# Patient Record
Sex: Male | Born: 1950
Health system: Southern US, Community
[De-identification: ages and names within clinical notes are randomized; demographics above are authoritative.]

## PROBLEM LIST (undated history)

## (undated) DIAGNOSIS — K648 Other hemorrhoids: Secondary | ICD-10-CM

## (undated) DIAGNOSIS — E785 Hyperlipidemia, unspecified: Secondary | ICD-10-CM

## (undated) DIAGNOSIS — C801 Malignant (primary) neoplasm, unspecified: Secondary | ICD-10-CM

## (undated) DIAGNOSIS — C61 Malignant neoplasm of prostate: Secondary | ICD-10-CM

## (undated) DIAGNOSIS — D352 Benign neoplasm of pituitary gland: Secondary | ICD-10-CM

## (undated) DIAGNOSIS — T7840XA Allergy, unspecified, initial encounter: Secondary | ICD-10-CM

## (undated) DIAGNOSIS — K219 Gastro-esophageal reflux disease without esophagitis: Secondary | ICD-10-CM

## (undated) DIAGNOSIS — K635 Polyp of colon: Secondary | ICD-10-CM

## (undated) HISTORY — DX: Malignant neoplasm of prostate: C61

## (undated) HISTORY — DX: Benign neoplasm of pituitary gland: D35.2

## (undated) HISTORY — PX: PROSTATECTOMY: SHX69

## (undated) HISTORY — DX: Malignant (primary) neoplasm, unspecified: C80.1

## (undated) HISTORY — DX: Polyp of colon: K63.5

## (undated) HISTORY — PX: COLONOSCOPY: SHX174

## (undated) HISTORY — DX: Other hemorrhoids: K64.8

## (undated) HISTORY — DX: Allergy, unspecified, initial encounter: T78.40XA

## (undated) HISTORY — DX: Hyperlipidemia, unspecified: E78.5

---

## 1990-02-11 DIAGNOSIS — D352 Benign neoplasm of pituitary gland: Secondary | ICD-10-CM

## 1990-02-11 HISTORY — DX: Benign neoplasm of pituitary gland: D35.2

## 2003-02-12 DIAGNOSIS — C801 Malignant (primary) neoplasm, unspecified: Secondary | ICD-10-CM

## 2003-02-12 DIAGNOSIS — C61 Malignant neoplasm of prostate: Secondary | ICD-10-CM

## 2003-02-12 HISTORY — DX: Malignant neoplasm of prostate: C61

## 2003-02-12 HISTORY — DX: Malignant (primary) neoplasm, unspecified: C80.1

## 2003-02-12 HISTORY — PX: PROSTATE SURGERY: SHX751

## 2005-05-22 ENCOUNTER — Ambulatory Visit: Payer: Self-pay | Admitting: Urology

## 2005-06-10 ENCOUNTER — Ambulatory Visit: Payer: Self-pay | Admitting: Urology

## 2006-03-20 ENCOUNTER — Ambulatory Visit: Payer: Self-pay | Admitting: Gastroenterology

## 2008-10-05 ENCOUNTER — Ambulatory Visit: Payer: Self-pay | Admitting: Family Medicine

## 2008-12-29 ENCOUNTER — Ambulatory Visit: Payer: Self-pay | Admitting: Gastroenterology

## 2010-02-11 DIAGNOSIS — K635 Polyp of colon: Secondary | ICD-10-CM

## 2010-02-11 HISTORY — DX: Polyp of colon: K63.5

## 2010-03-15 DIAGNOSIS — E230 Hypopituitarism: Secondary | ICD-10-CM | POA: Insufficient documentation

## 2011-03-21 DIAGNOSIS — E781 Pure hyperglyceridemia: Secondary | ICD-10-CM | POA: Insufficient documentation

## 2013-02-19 ENCOUNTER — Ambulatory Visit: Payer: Self-pay | Admitting: Family Medicine

## 2014-03-16 ENCOUNTER — Ambulatory Visit (INDEPENDENT_AMBULATORY_CARE_PROVIDER_SITE_OTHER): Payer: BLUE CROSS/BLUE SHIELD | Admitting: Endocrinology

## 2014-03-16 ENCOUNTER — Other Ambulatory Visit: Payer: Self-pay

## 2014-03-16 ENCOUNTER — Encounter: Payer: Self-pay | Admitting: Endocrinology

## 2014-03-16 VITALS — BP 118/78 | HR 72 | Resp 14 | Ht 71.0 in | Wt 205.0 lb

## 2014-03-16 DIAGNOSIS — N529 Male erectile dysfunction, unspecified: Secondary | ICD-10-CM

## 2014-03-16 DIAGNOSIS — D352 Benign neoplasm of pituitary gland: Secondary | ICD-10-CM

## 2014-03-16 NOTE — Progress Notes (Signed)
HPI  Zachary Hayden is a 64 y.o.-year-old male, referred by his PCP, Dr. Rutherford Nail, for evaluation for prolactinoma. He was previously seen Dr Manus Rudd at [facility] , last visit 01/2013- however expense is an concern - hence establishing care here.  Records reviewed from [facility] and patient records. Synopsis is below. Prior hx hyperprolactinemia since 2000. He was first diagnosed  On imaging in 2005 when his prolactin was 960, and MRI showed a 15 mmx 39mm x 20 mm lesion. He has had good suppression of his prolactin with bromocriptine, but when it was tried to be tapered off after 3 years 06/2009, his prolactin levels started to rise in normal range. Otherwise it has been well suppressed. Currently he is on BCR 2.5 twice daily, no problems with fatigue,nausea, LH, or headaches. No breast discharge. Has stable mild enlargement of the breasts bilaterally without any masses.  For his pituitary tumor, it has not shown growth but neither has it shrunk on the medication. Last MRI 2013, no change. No vision loss or headaches. Last visual field test 11/2013, Dr.Charles Sydnor at [facility]. Goes annually. No changes in VF testing per patient report. No change in shoe size. No steroid use. No LH/N/abdominal pain or hypotension.   Additionally, he had hypogonadism when the prolactin was high, which recovered when the prolactin was suppressed. He also developed prostate cancer treated with prostatectomy, so  He has not been supplemented for his testosterone, but it has continued to improve within the normal range, so that has not been necessary.He has a good sense of libido, but mild erectile dysfunction. In the past, he has noticed the significant decrease in libido when his prolactin has risen and testosterone has declined. He reports no new medical problems. Reports that prostrate cancer is in remission. Having occasional hot flashes , no muscle weakness. Feels that his Testosterone levels may be slightly worse  recently.  Would like to have his labs checked locally at [facility]- also needs some testing ordered by his PCP. Would like to wait till age 26 when he qualifies for medicare, to do his follow up MRI testing if possible.   I reviewed his chart and he also has a history of ED and prior low Testosterone.  Prior lab review-  11/1998- PRL 548.8, TSH 1.91, T4 8.6 2001-PRL 660 01/2003- FSH<0.3, LH <0.3, PRL 939 MRI Jan 2005, [facility]- enlarged pituitary gland slight to right of midline, homogenous enhancement post contrast, no stalk deviation, no impingement on optic chiasm, no narrowing of cavernous sinus, ICA; tumor 1.5 x1.5 x2cm, enhancing nodule within pituitary as well 04/2003- TSH 2.69, free T4 0.95, PRL 862, cortisol 13.4, Testosterone 298 ( 179-756) PTH 54, IGF-1 178 ( 90-360) October 2005 - Testosterone low at 20- was on Androgel at that time 01/2004- PRL 9 04/2004- PRL 6, Testosterone 354  MRI Dec 2006- 18mm pituitary mass, decreased T1 signal , increased T2 and hypoenhancement right aspect of sella, displacing pituitary to left 02/2005- PRL 3, Testosterone 257 02/2006-PRL 5, Testosterone 318 03/2008 PRL 3, Testosterone 278 02/2009- PRL 5, Testosterone 310>>dose reduced to bromocriptine 2.5 mg daily 05/2009 -PRL 7, Testos 271 10/2009-PRL 10 03/2010 -PRL 9>>dose increased to 5mg  daily 03/2011-PRL 3, Testos 330  MRI pituitary 01/2012, [facility]- compared to 2010 1.5 x 1.1 cm enhacing mass in the right sella involving right cavernous sinus and displacing pitiutary stalk to left ( grossly unchanged appearance as compared to prior)  02/2012-PRL 6, Testos 329  I have reviewed the patient's past medical  history, family and social history, surgical history, medications and allergies.  Past Medical History  Diagnosis Date  . Pituitary adenoma   . Hyperlipidemia   . Allergy   . Cancer 2005    prostate  . Prostate cancer 2005   Past Surgical History  Procedure Laterality Date  . Prostate surgery  2005     radical  . Prostatectomy     Family History  Problem Relation Age of Onset  . Heart disease Father   . Hyperlipidemia Father   . Hypertension Father   . Other Cousin    History   Social History  . Marital Status: Married    Spouse Name: N/A    Number of Children: N/A  . Years of Education: N/A   Occupational History  . Not on file.   Social History Main Topics  . Smoking status: Never Smoker   . Smokeless tobacco: Never Used  . Alcohol Use: 0.0 oz/week    0 Not specified per week     Comment: glass of wine  . Drug Use: No  . Sexual Activity: Not on file   Other Topics Concern  . Not on file   Social History Narrative   No current outpatient prescriptions on file prior to visit.   No current facility-administered medications on file prior to visit.   No Known Allergies   Review of Systems: [x]  complains of  [  ] denies General:   [  ] Recent weight change [  ] Fatigue  [  ] Loss of appetite Eyes: [  ]  Vision Difficulty [  ]  Eye pain ENT: [  ]  Hearing difficulty [  ]  Difficulty Swallowing CVS: [  ] Chest pain [  ]  Palpitations/Irregular Heart beat [  ]  Shortness of breath lying flat [  ] Swelling of legs Resp: [  ] Frequent Cough [  ] Shortness of Breath  [  ]  Wheezing GI: [ x ] Heartburn  [  ] Nausea or Vomiting  [  ] Diarrhea [  ] Constipation  [  ] Abdominal Pain GU: [  ]  Polyuria  [ x ]  nocturia Bones/joints:  [  ]  Muscle aches  [  ] Joint Pain  [  ] Bone pain Skin/Hair/Nails: [  ]  Rash  [  ] New stretch marks [  ]  Itching [  ] Hair loss [  ]  Excessive hair growth Reproduction: [ x ] Low sexual desire , [  ]  Women: Menstrual cycle problems [  ]  Women: Breast Discharge [  ] Men: Difficulty with erections [ x ]  Men: Enlarged Breasts CNS: [  ] Frequent Headaches [  ] Blurry vision [  ] Tremors [  ] Seizures [  ] Loss of consciousness [  ] Localized weakness Endocrine: [  ]  Excess thirst [ x ]  Feeling excessively hot [  ]  Feeling excessively  cold Heme: [  ]  Easy bruising [  ]  Enlarged glands or lumps in neck Allergy: [  ]  Food allergies [  ] Environmental allergies   PE: Chaperone present BP 118/78 mmHg  Pulse 72  Resp 14  Ht 5\' 11"  (1.803 m)  Wt 205 lb (92.987 kg)  BMI 28.60 kg/m2  SpO2 98% Wt Readings from Last 3 Encounters:  03/16/14 205 lb (92.987 kg)   HEENT: [place]/AT, EOMI, no icterus, no proptosis,  no chemosis, no mild lid lag, no retraction, eyes close completely, gross normal VF testing on confrontation Neck: thyroid gland - smooth, non-tender, no erythema, no tracheal deviation; negative Pemberton's sign; no lymphadenopathy; no bruits Lungs: good air entry, clear bilaterally Heart: S1&S2 normal, regular rate & rhythm; no murmurs, rubs or gallops Breast: some gynecomastia but no discharge Abd: soft, NT, ND, no HSM, +BS, no abnormal straie Ext: no tremor in hands bilaterally, no edema, 2+ DP/PT pulses, good muscle mass Neuro: normal gait, 2+ reflexes bilaterally, normal 5/5 strength, no proximal myopathy  Derm: no pretibial myxoedema/skin dryness  ASSESSMENT: 1. Prolactinoma, stable macroadenoma since 2005 on treatment with Bromocriptine 2. Secondary hypogonadism and ED. Hx prostrate cancer, now in remission.  PLAN:  Problem List Items Addressed This Visit      Endocrine   Prolactinoma - Primary     Hyperprolactinoma- Check labs locally for Prolactin levels. Continue current bromocriptine for now. Unable to taper further as stable macroadenoma on imaging. Adjust dose based on levels.  Also, screen for associated IGF-1 oversecretion. BP at target, test electrolytes and thyroid function.    Pituitary tumor- Can continue current Bromocriptine to maintain normal PRL levels for now. Alternatively, dose can be increased to aim for tumor shrinkage. Will check levels first. Will consider repeat imaging this year if levels are rising. Encouraging that recent VF testing was normal per patient. Per review of prior  endocrine notes at [facility],   His initial MRI from 2005 suggested hemorrhage and fibrosis, and size has not significantly decreased since then, nor has it grown.  RTC 1 year          Genitourinary   Erectile dysfunction     Secondary hypogonadism- Check morning Testosterone level. Is not a candidate for Testosterone replacement due to hx prostrate cancer.  It has previously been recovered after PRL levels normalized. Continue current Rx for ED.         Analycia Khokhar [facility] 03/21/2014 12:06 PM

## 2014-03-16 NOTE — Progress Notes (Signed)
Pre visit review using our clinic review tool, if applicable. No additional management support is needed unless otherwise documented below in the visit note. 

## 2014-03-16 NOTE — Patient Instructions (Signed)
Monitor symptoms for prolactin excess.  Labs at [facility] ( slip given)  Morning labs .  Please return in 1 year.

## 2014-03-17 ENCOUNTER — Encounter: Payer: Self-pay | Admitting: Endocrinology

## 2014-03-17 DIAGNOSIS — E785 Hyperlipidemia, unspecified: Secondary | ICD-10-CM | POA: Insufficient documentation

## 2014-03-17 DIAGNOSIS — N529 Male erectile dysfunction, unspecified: Secondary | ICD-10-CM | POA: Insufficient documentation

## 2014-03-17 LAB — HEPATIC FUNCTION PANEL
ALK PHOS: 43 U/L (ref 25–125)
ALT: 41 U/L — AB (ref 10–40)
AST: 35 U/L (ref 14–40)
BILIRUBIN, TOTAL: 0.5 mg/dL

## 2014-03-17 LAB — BASIC METABOLIC PANEL
BUN: 15 mg/dL (ref 4–21)
CREATININE: 1.2 mg/dL (ref 0.6–1.3)
Glucose: 100 mg/dL
Potassium: 4.7 mmol/L (ref 3.4–5.3)
SODIUM: 141 mmol/L (ref 137–147)

## 2014-03-17 LAB — LIPID PANEL
CHOLESTEROL: 159 mg/dL (ref 0–200)
HDL: 63 mg/dL (ref 35–70)
LDL Cholesterol: 77 mg/dL
LDl/HDL Ratio: 1.2
TRIGLYCERIDES: 94 mg/dL (ref 40–160)

## 2014-03-17 LAB — TSH: TSH: 1.93 u[IU]/mL (ref 0.41–5.90)

## 2014-03-21 NOTE — Assessment & Plan Note (Signed)
  Hyperprolactinoma- Check labs locally for Prolactin levels. Continue current bromocriptine for now. Unable to taper further as stable macroadenoma on imaging. Adjust dose based on levels.  Also, screen for associated IGF-1 oversecretion. BP at target, test electrolytes and thyroid function.    Pituitary tumor- Can continue current Bromocriptine to maintain normal PRL levels for now. Alternatively, dose can be increased to aim for tumor shrinkage. Will check levels first. Will consider repeat imaging this year if levels are rising. Encouraging that recent VF testing was normal per patient. Per review of prior endocrine notes at [facility],   His initial MRI from 2005 suggested hemorrhage and fibrosis, and size has not significantly decreased since then, nor has it grown.  RTC 1 year

## 2014-03-21 NOTE — Assessment & Plan Note (Signed)
  Secondary hypogonadism- Check morning Testosterone level. Is not a candidate for Testosterone replacement due to hx prostrate cancer.  It has previously been recovered after PRL levels normalized. Continue current Rx for ED.

## 2014-03-22 ENCOUNTER — Encounter: Payer: Self-pay | Admitting: *Deleted

## 2014-03-31 ENCOUNTER — Other Ambulatory Visit: Payer: Self-pay | Admitting: Endocrinology

## 2014-03-31 ENCOUNTER — Other Ambulatory Visit: Payer: Self-pay

## 2014-03-31 DIAGNOSIS — D352 Benign neoplasm of pituitary gland: Secondary | ICD-10-CM

## 2014-03-31 MED ORDER — BROMOCRIPTINE MESYLATE 2.5 MG PO TABS: 2.5000 mg | ORAL_TABLET | Freq: Two times a day (BID) | ORAL | Status: DC

## 2014-04-07 ENCOUNTER — Encounter: Payer: Self-pay | Admitting: General Surgery

## 2014-04-07 ENCOUNTER — Ambulatory Visit (INDEPENDENT_AMBULATORY_CARE_PROVIDER_SITE_OTHER): Payer: BLUE CROSS/BLUE SHIELD | Admitting: General Surgery

## 2014-04-07 VITALS — BP 126/78 | HR 82 | Resp 12 | Ht 71.0 in | Wt 207.0 lb

## 2014-04-07 DIAGNOSIS — K625 Hemorrhage of anus and rectum: Secondary | ICD-10-CM

## 2014-04-07 DIAGNOSIS — K602 Anal fissure, unspecified: Secondary | ICD-10-CM

## 2014-04-07 LAB — POC HEMOCCULT BLD/STL (OFFICE/1-CARD/DIAGNOSTIC)
Card #1 Date: NEGATIVE
Fecal Occult Blood, POC: NEGATIVE

## 2014-04-07 MED ORDER — HYDROCORTISONE ACE-PRAMOXINE 2.5-1 % RE CREA: 1.0000 "application " | TOPICAL_CREAM | Freq: Three times a day (TID) | RECTAL | Status: DC

## 2014-04-07 NOTE — Patient Instructions (Addendum)
The patient is aware to call back for any questions or concerns.  Anal Fissure, Adult An anal fissure is a small tear or crack in the skin around the anus. Bleeding from a fissure usually stops on its own within a few minutes. However, bleeding will often reoccur with each bowel movement until the crack heals.  CAUSES   Passing large, hard stools.  Frequent diarrheal stools.  Constipation.  Inflammatory bowel disease (Crohn's disease or ulcerative colitis).  Infections.  Anal sex. SYMPTOMS   Small amounts of blood seen on your stools, on toilet paper, or in the toilet after a bowel movement.  Rectal bleeding.  Painful bowel movements.  Itching or irritation around the anus. DIAGNOSIS Your caregiver will examine the anal area. An anal fissure can usually be seen with careful inspection. A rectal exam may be performed and a short tube (anoscope) may be used to examine the anal canal. TREATMENT   You may be instructed to take fiber supplements. These supplements can soften your stool to help make bowel movements easier.  Sitz baths may be recommended to help heal the tear. Do not use soap in the sitz baths.  A medicated cream or ointment may be prescribed to lessen discomfort. HOME CARE INSTRUCTIONS   Maintain a diet high in fruits, whole grains, and vegetables. Avoid constipating foods like bananas and dairy products.  Take sitz baths as directed by your caregiver.  Drink enough fluids to keep your urine clear or pale yellow.  Only take over-the-counter or prescription medicines for pain, discomfort, or fever as directed by your caregiver. Do not take aspirin as this may increase bleeding.  Do not use ointments containing numbing medications (anesthetics) or hydrocortisone. They could slow healing. SEEK MEDICAL CARE IF:   Your fissure is not completely healed within 3 days.  You have further bleeding.  You have a fever.  You have diarrhea mixed with blood.  You  have pain.  Your problem is getting worse rather than better. MAKE SURE YOU:   Understand these instructions.  Will watch your condition.  Will get help right away if you are not doing well or get worse. Document Released: 01/28/2005 Document Revised: 04/22/2011 Document Reviewed: 07/15/2010 [facility] Patient Information 2015 [place], [place]. This information is not intended to replace advice given to you by your health care provider. Make sure you discuss any questions you have with your health care provider.

## 2014-04-07 NOTE — Progress Notes (Signed)
Patient ID: Linward Natal III, male   DOB: 06-Dec-1950, 64 y.o.   MRN: [id number]  Chief Complaint  Patient presents with  . Rectal Bleeding    hemorrhoids    HPI TYE VIGO III is a 64 y.o. male.  Here today to evaluate for hemorrhoids. He occasional has issues with bleeding from hemorrhoids for over 10-20- years. Here lately he has noticed more bleeding and it is worse when he has loose bowel movements. Bleeding is noted on the tissue. Occasional leakage of stool is noted. He can go some days with no bleeding. Bowels generally move daily. He was seen in 2002 for hemorrhoid issues. Examination at that time was negative for anal fissure.  HPI  Past Medical History  Diagnosis Date  . Pituitary adenoma 1992  . Hyperlipidemia   . Allergy   . Cancer 2005    prostate  . Prostate cancer 2005  . Colon polyp 2012    3 polyps  . Internal hemorrhoids     Past Surgical History  Procedure Laterality Date  . Prostate surgery  2005    radical  . Prostatectomy    . Colonoscopy  2010, 2012, 2014    Dr. Dionne Milo    Family History  Problem Relation Age of Onset  . Heart disease Father   . Hyperlipidemia Father   . Hypertension Father   . Other Cousin     Social History History  Substance Use Topics  . Smoking status: Never Smoker   . Smokeless tobacco: Never Used  . Alcohol Use: 0.0 oz/week    0 Standard drinks or equivalent per week     Comment: glass of wine    No Known Allergies  Current Outpatient Prescriptions  Medication Sig Dispense Refill  . aspirin EC 81 MG tablet Take 81 mg by mouth daily.    Marland Kitchen atorvastatin (LIPITOR) 20 MG tablet Take 20 mg by mouth daily.  0  . bromocriptine (PARLODEL) 2.5 MG tablet Take 1 tablet (2.5 mg total) by mouth 2 (two) times daily. 60 tablet 0  . calcium carbonate (OS-CAL) 600 MG TABS tablet Take 600 mg by mouth.    . diphenhydrAMINE (BENADRYL) 25 MG tablet Take 25 mg by mouth daily.    Marland Kitchen docusate sodium (COLACE) 100 MG capsule Take 100  mg by mouth 2 (two) times daily.    . fenofibrate 160 MG tablet Take 160 mg by mouth daily.  3  . flunisolide (NASALIDE) 25 MCG/ACT (0.025%) SOLN Place 1 spray into the nose daily.    Marland Kitchen MINERAL OIL LIGHT PO Take by mouth daily.    . niacin 500 MG tablet Take 500 mg by mouth at bedtime.    . Omega-3 Fatty Acids (FISH OIL) 500 MG CAPS Take 1 capsule by mouth daily.    . ranitidine (ZANTAC) 75 MG tablet Take 75 mg by mouth daily.    . sildenafil (VIAGRA) 100 MG tablet Take 100 mg by mouth as needed for erectile dysfunction.    Marland Kitchen therapeutic multivitamin-minerals (THERAGRAN-M) tablet Take 1 tablet by mouth daily.    . vitamin C (ASCORBIC ACID) 500 MG tablet Take 500 mg by mouth daily.    . hydrocortisone-pramoxine (ANALPRAM HC) 2.5-1 % rectal cream Place 1 application rectally 3 (three) times daily. 30 g 0   No current facility-administered medications for this visit.    Review of Systems Review of Systems  Constitutional: Negative.   Respiratory: Negative.   Cardiovascular: Negative.  Blood pressure 126/78, pulse 82, resp. rate 12, height 5\' 11"  (1.803 m), weight 207 lb (93.895 kg).  Physical Exam Physical Exam  Constitutional: He is oriented to person, place, and time. He appears well-developed and well-nourished.  Neck: Neck supple.  Cardiovascular: Normal rate, regular rhythm and normal heart sounds.   Pulses:      Femoral pulses are 2+ on the right side, and 2+ on the left side. Pulmonary/Chest: Effort normal and breath sounds normal.  Abdominal: Soft. Bowel sounds are normal.  Genitourinary: Rectal exam shows fissure. Guaiac negative stool.  Anal fissure and tender posteriorly.   Lymphadenopathy:    He has no cervical adenopathy.  Neurological: He is alert and oriented to person, place, and time.  Skin: Skin is warm and dry.   Data Review:  Colonoscopy report dated 03/20/2006 showed 2, 8 mm tubular adenomas in the transverse colon.  Colonoscopy dated 12/29/2008 was  notable for internal hemorrhoids.  Results of the 2014 exam are not available. (The patient reports no biopsies completed)  Assessment    Anal fissure, possible internal hemorrhoids.    Plan    The patient reports failing a trial medicated suppositories. If the fissure is the primary source of his bleeding, it's likely the medication did not cover this area. Prior to considering lateral internal sphincterotomy with a Jabier Mutton out hemorrhoidectomy, the patient was interested in a trial of conservative treatment. He has been asked to give a phone follow-up in 3 weeks. If he is not significantly improved operative intervention may be appropriate. If his symptoms have resolved he may use the antrum cream on a when necessary basis.  If he becomes asymptomatic he would be a candidate for a follow-up colonoscopy in 2019.      Trial of Analpram cream TID. Discussed surgical options as well.     PCP:  Mamie Laurel 04/08/2014, 1:32 PM

## 2014-04-08 DIAGNOSIS — K602 Anal fissure, unspecified: Secondary | ICD-10-CM | POA: Insufficient documentation

## 2014-04-08 DIAGNOSIS — K625 Hemorrhage of anus and rectum: Secondary | ICD-10-CM | POA: Insufficient documentation

## 2014-04-11 ENCOUNTER — Encounter: Payer: Self-pay | Admitting: Endocrinology

## 2014-05-20 ENCOUNTER — Other Ambulatory Visit: Payer: Self-pay | Admitting: *Deleted

## 2014-05-20 ENCOUNTER — Telehealth: Payer: Self-pay | Admitting: *Deleted

## 2014-05-20 NOTE — Telephone Encounter (Signed)
Spoke to patient to notify him of Dr. Boyd Kerbs comments. Patient verbalized understanding. Patient stated that he does not believe a follow up appt is necessary at this time. He would just like to change medications like you suggested before to see if the growth will shrink over time. Please advise.

## 2014-05-20 NOTE — Telephone Encounter (Signed)
A user error has taken place.

## 2014-05-20 NOTE — Telephone Encounter (Addendum)
Pt called requesting MRI results from 3.5.16 at [facility] imaging.  Results are under care everywhere via EPIC.  Please advise

## 2014-05-20 NOTE — Telephone Encounter (Signed)
Please let him know that I finally got the results of his MRI last evening.  The MRI results show stable prolactinoma within right side of pituitary ( 1.5 x1.2 x1.2 cm) compared to 2013 scan.  If we want to discuss change in treatment plan, I think its best we schedule a follow visit.

## 2014-05-24 ENCOUNTER — Other Ambulatory Visit: Payer: Self-pay | Admitting: Endocrinology

## 2014-05-24 MED ORDER — CABERGOLINE 0.5 MG PO TABS: 0.2500 mg | ORAL_TABLET | ORAL | Status: DC

## 2014-05-24 NOTE — Telephone Encounter (Signed)
Noted. I have discussed his MRI findings with him over the phone.  He has elected to switch to Cabergoline- will have him stop BCR and start Cabergoline at 0.25 mg twice weekly. Follow up at clinic appointment in 62month and will recheck PRL levels. If rising in normal range, then will consider a dose increase in Cabergoline dose. Pt reports that he has tried this med in the past, and tolerated it well. Common side effects and risk of valvular heart disease discussed. All questions answered and patient agreeable to the plan.   Please schedule 1 month follow up appt and paste this info in telephone note. thanks

## 2014-05-25 NOTE — Telephone Encounter (Signed)
Called patient to set up follow up appt. No answer left voicemail to notify patient he needed to contact the office to schedule a one month follow up appt with Dr. Howell Rucks. Left office number on voicemail so patient can call. Also left my ext on voicemail if patient has any question and would like to speak to me personally.

## 2014-07-07 ENCOUNTER — Encounter: Payer: Self-pay | Admitting: Endocrinology

## 2014-07-07 ENCOUNTER — Encounter (INDEPENDENT_AMBULATORY_CARE_PROVIDER_SITE_OTHER): Payer: Self-pay

## 2014-07-07 ENCOUNTER — Ambulatory Visit (INDEPENDENT_AMBULATORY_CARE_PROVIDER_SITE_OTHER): Payer: BLUE CROSS/BLUE SHIELD | Admitting: Endocrinology

## 2014-07-07 VITALS — BP 124/62 | HR 68 | Resp 14 | Ht 71.0 in | Wt 205.5 lb

## 2014-07-07 DIAGNOSIS — D352 Benign neoplasm of pituitary gland: Secondary | ICD-10-CM

## 2014-07-07 NOTE — Patient Instructions (Signed)
Check PRL levels.  Adjust dose of cabergoline based on levels.  Please come back for a follow-up appointment in 1 month.

## 2014-07-07 NOTE — Progress Notes (Signed)
Pre visit review using our clinic review tool, if applicable. No additional management support is needed unless otherwise documented below in the visit note. 

## 2014-07-07 NOTE — Progress Notes (Signed)
Patient ID: Linward Natal III, male   DOB: Jan 04, 1951, 64 y.o.   MRN: [id number]    HPI  HENNING EHLE III is a 64 y.o.-year-old male,  for follow up for prolactinoma. He was previously seen Dr Manus Rudd at [facility] , last visit 01/2013- however expense is an concern. Last visit Feb 2016.    Records reviewed from [facility] and patient records. Synopsis is below. Prior hx hyperprolactinemia since 2000. He was first diagnosed  On imaging in 2005 when his prolactin was 960, and MRI showed a 15 mmx 79mm x 20 mm lesion. He has had good suppression of his prolactin with bromocriptine, but when it was tried to be tapered off after 3 years 06/2009, his prolactin levels started to rise in normal range. Otherwise it has been well suppressed . He was on BCR 2.5 twice daily, no problems with fatigue,nausea, LH, or headaches. No breast discharge. Has stable mild enlargement of the breasts bilaterally without any masses.  For his pituitary tumor, it has not shown growth but neither has it shrunk on the medication. Last MRI 2016, no change. No vision loss or headaches. Last visual field test 11/2013, Dr.Charles Sydnor at [facility]. Goes annually. No changes in VF testing per patient report. No change in shoe size. No steroid use. No LH/N/abdominal pain or hypotension.  Interim: since last time, testing for pituitary hormones was normal except hypogonadism as below. Elected to switch to cabergoline to see if tumor regresses. Now on Cabergoline 0.25 mg twice weekly. Tolerating well.    Additionally, he had hypogonadism when the prolactin was high, which recovered when the prolactin was suppressed. He also developed prostate cancer treated with prostatectomy, so  He has not been supplemented for his testosterone, but it has continued to improve within the normal range, so that has not been necessary.He has a good sense of libido, but mild erectile dysfunction. In the past, he has noticed the significant decrease in libido when his  prolactin has risen and testosterone has declined. He reports no new medical problems. Reports that prostrate cancer is in remission. Having occasional hot flashes , no muscle weakness. Feels that his Testosterone levels may be slightly worse recently.  Had levels checked with his Urologist recently and they were slightly lower.  I reviewed his chart and he also has a history of ED and prior low Testosterone.  Prior lab review-  11/1998- PRL 548.8, TSH 1.91, T4 8.6 2001-PRL 660 01/2003- FSH<0.3, LH <0.3, PRL 939 MRI Jan 2005, [facility]- enlarged pituitary gland slight to right of midline, homogenous enhancement post contrast, no stalk deviation, no impingement on optic chiasm, no narrowing of cavernous sinus, ICA; tumor 1.5 x1.5 x2cm, enhancing nodule within pituitary as well 04/2003- TSH 2.69, free T4 0.95, PRL 862, cortisol 13.4, Testosterone 298 ( 179-756) PTH 54, IGF-1 178 ( 90-360) October 2005 - Testosterone low at 20- was on Androgel at that time 01/2004- PRL 9 04/2004- PRL 6, Testosterone 354  MRI Dec 2006- 100mm pituitary mass, decreased T1 signal , increased T2 and hypoenhancement right aspect of sella, displacing pituitary to left 02/2005- PRL 3, Testosterone 257 02/2006-PRL 5, Testosterone 318 03/2008 PRL 3, Testosterone 278 02/2009- PRL 5, Testosterone 310>>dose reduced to bromocriptine 2.5 mg daily 05/2009 -PRL 7, Testos 271 10/2009-PRL 10 03/2010 -PRL 9>>dose increased to 5mg  daily 03/2011-PRL 3, Testos 330  MRI pituitary 01/2012, [facility]- compared to 2010 1.5 x 1.1 cm enhacing mass in the right sella involving right cavernous sinus and displacing pitiutary  stalk to left ( grossly unchanged appearance as compared to prior)  02/2012-PRL 6, Testos 329  The MRI results show stable prolactinoma within right side of pituitary ( 1.5 x1.2 x1.2 cm) compared to 2013 scan.   I have reviewed the patient's past medical history, family and social history, surgical history, medications and allergies.    Current Outpatient Prescriptions on File Prior to Visit  Medication Sig Dispense Refill  . aspirin EC 81 MG tablet Take 81 mg by mouth daily.    Marland Kitchen atorvastatin (LIPITOR) 20 MG tablet Take 20 mg by mouth daily.  0  . cabergoline (DOSTINEX) 0.5 MG tablet Take 0.5 tablets (0.25 mg total) by mouth 2 (two) times a week. 10 tablet 3  . calcium carbonate (OS-CAL) 600 MG TABS tablet Take 600 mg by mouth.    . diphenhydrAMINE (BENADRYL) 25 MG tablet Take 25 mg by mouth daily.    Marland Kitchen docusate sodium (COLACE) 100 MG capsule Take 100 mg by mouth 2 (two) times daily.    . fenofibrate 160 MG tablet Take 160 mg by mouth daily.  3  . flunisolide (NASALIDE) 25 MCG/ACT (0.025%) SOLN Place 1 spray into the nose daily.    . hydrocortisone-pramoxine (ANALPRAM HC) 2.5-1 % rectal cream Place 1 application rectally 3 (three) times daily. 30 g 0  . MINERAL OIL LIGHT PO Take by mouth daily.    . niacin 500 MG tablet Take 500 mg by mouth at bedtime.    . Omega-3 Fatty Acids (FISH OIL) 500 MG CAPS Take 1 capsule by mouth daily.    . ranitidine (ZANTAC) 75 MG tablet Take 75 mg by mouth daily.    . sildenafil (VIAGRA) 100 MG tablet Take 100 mg by mouth as needed for erectile dysfunction.    Marland Kitchen therapeutic multivitamin-minerals (THERAGRAN-M) tablet Take 1 tablet by mouth daily.    . vitamin C (ASCORBIC ACID) 500 MG tablet Take 500 mg by mouth daily.     No current facility-administered medications on file prior to visit.   No Known Allergies    PE:  BP 124/62 mmHg  Pulse 68  Resp 14  Ht 5\' 11"  (1.803 m)  Wt 205 lb 8 oz (93.214 kg)  BMI 28.67 kg/m2  SpO2 97% Wt Readings from Last 3 Encounters:  07/07/14 205 lb 8 oz (93.214 kg)  04/07/14 207 lb (93.895 kg)  03/16/14 205 lb (92.987 kg)   HEENT: [place]/AT, EOMI, no icterus, no proptosis, no chemosis, no mild lid lag, no retraction, eyes close completely, gross normal VF testing on confrontation Neck: thyroid gland - smooth, non-tender, no erythema, no tracheal  deviation; negative Pemberton's sign; no lymphadenopathy; no bruits Lungs: good air entry, clear bilaterally Heart: S1&S2 normal, regular rate & rhythm; no murmurs, rubs or gallops (Prior)Breast: some gynecomastia but no discharge Ext: no tremor in hands bilaterally, no edema, 2+ DP/PT pulses, good muscle mass Neuro: normal gait, 2+ reflexes bilaterally, normal 5/5 strength, no proximal myopathy  Derm: no pretibial myxoedema/skin dryness  ASSESSMENT: 1. Prolactinoma, stable macroadenoma since 2005 on treatment with Bromocriptine 2. Secondary hypogonadism and ED. Hx prostrate cancer, now in remission.  PLAN:  Problem List Items Addressed This Visit      Endocrine   Prolactinoma - Primary     Hyperprolactinoma- Check labs for Prolactin levels. Continue current cabergoline for now. Unable to taper further as stable macroadenoma on imaging. Adjust dose based on levels to normalize prolactin. Likely will need lifelong therapy. Small chance of regression on cabergoline.  Pituitary tumor- Can continue current cabergline to maintain normal PRL levels for now. Repeat MRI 1 year. Encouraging that recent VF testing was normal per patient. Per review of prior endocrine notes at [facility],   His initial MRI from 2005 suggested hemorrhage and fibrosis, and size has not significantly decreased since then, nor has it grown.          Relevant Orders   Prolactin (Completed)      RTC 3 months Symphany Fleissner [facility] 07/10/2014 10:41 AM

## 2014-07-08 LAB — PROLACTIN: PROLACTIN: 3.6 ng/mL (ref 2.1–17.1)

## 2014-07-10 NOTE — Assessment & Plan Note (Signed)
  Hyperprolactinoma- Check labs for Prolactin levels. Continue current cabergoline for now. Unable to taper further as stable macroadenoma on imaging. Adjust dose based on levels to normalize prolactin. Likely will need lifelong therapy. Small chance of regression on cabergoline.      Pituitary tumor- Can continue current cabergline to maintain normal PRL levels for now. Repeat MRI 1 year. Encouraging that recent VF testing was normal per patient. Per review of prior endocrine notes at [facility],   His initial MRI from 2005 suggested hemorrhage and fibrosis, and size has not significantly decreased since then, nor has it grown.

## 2014-08-30 ENCOUNTER — Encounter: Payer: Self-pay | Admitting: Family Medicine

## 2014-08-30 ENCOUNTER — Other Ambulatory Visit: Payer: Self-pay | Admitting: Family Medicine

## 2014-08-30 ENCOUNTER — Ambulatory Visit (INDEPENDENT_AMBULATORY_CARE_PROVIDER_SITE_OTHER): Payer: BLUE CROSS/BLUE SHIELD | Admitting: Family Medicine

## 2014-08-30 VITALS — BP 118/68 | HR 77 | Temp 97.7°F | Resp 16 | Ht 71.0 in | Wt 204.0 lb

## 2014-08-30 DIAGNOSIS — M8580 Other specified disorders of bone density and structure, unspecified site: Secondary | ICD-10-CM | POA: Diagnosis not present

## 2014-08-30 DIAGNOSIS — E291 Testicular hypofunction: Secondary | ICD-10-CM | POA: Diagnosis not present

## 2014-08-30 DIAGNOSIS — Z0000 Encounter for general adult medical examination without abnormal findings: Secondary | ICD-10-CM | POA: Diagnosis not present

## 2014-08-30 MED ORDER — FENOFIBRATE 160 MG PO TABS: 160.0000 mg | ORAL_TABLET | Freq: Every day | ORAL | Status: DC

## 2014-08-30 MED ORDER — ATORVASTATIN CALCIUM 20 MG PO TABS: 20.0000 mg | ORAL_TABLET | Freq: Every day | ORAL | Status: DC

## 2014-08-30 MED ORDER — FLUNISOLIDE 25 MCG/ACT (0.025%) NA SOLN: 1.0000 | Freq: Every day | NASAL | Status: DC

## 2014-08-30 MED ORDER — HYDROCORTISONE ACE-PRAMOXINE 2.5-1 % RE CREA: 1.0000 "application " | TOPICAL_CREAM | Freq: Three times a day (TID) | RECTAL | Status: DC

## 2014-08-30 NOTE — Patient Instructions (Signed)

## 2014-08-30 NOTE — Progress Notes (Signed)
Name: FLEETWOOD PIERRON   MRN: [id number]    DOB: 1950/06/10   Date:08/30/2014       Progress Note  Subjective  Chief Complaint  Chief Complaint  Patient presents with  . Annual Exam    HPI  64 year old male presents for annual H&P. Problems been stable.  Past Medical History  Diagnosis Date  . Pituitary adenoma 1992  . Hyperlipidemia   . Allergy   . Cancer 2005    prostate  . Prostate cancer 2005  . Colon polyp 2012    3 polyps  . Internal hemorrhoids     History  Substance Use Topics  . Smoking status: Never Smoker   . Smokeless tobacco: Never Used  . Alcohol Use: 0.0 oz/week    0 Standard drinks or equivalent per week     Comment: glass of wine     Current outpatient prescriptions:  .  aspirin EC 81 MG tablet, Take 81 mg by mouth daily., Disp: , Rfl:  .  atorvastatin (LIPITOR) 20 MG tablet, Take 20 mg by mouth daily., Disp: , Rfl: 0 .  cabergoline (DOSTINEX) 0.5 MG tablet, Take 0.5 tablets (0.25 mg total) by mouth 2 (two) times a week., Disp: 10 tablet, Rfl: 3 .  calcium carbonate (OS-CAL) 600 MG TABS tablet, Take 600 mg by mouth., Disp: , Rfl:  .  diphenhydrAMINE (BENADRYL) 25 MG tablet, Take 25 mg by mouth daily., Disp: , Rfl:  .  docusate sodium (COLACE) 100 MG capsule, Take 100 mg by mouth 2 (two) times daily., Disp: , Rfl:  .  fenofibrate 160 MG tablet, Take 160 mg by mouth daily., Disp: , Rfl: 3 .  flunisolide (NASALIDE) 25 MCG/ACT (0.025%) SOLN, Place 1 spray into the nose daily., Disp: , Rfl:  .  hydrocortisone-pramoxine (ANALPRAM HC) 2.5-1 % rectal cream, Place 1 application rectally 3 (three) times daily., Disp: 30 g, Rfl: 0 .  niacin 500 MG tablet, Take 500 mg by mouth at bedtime., Disp: , Rfl:  .  Omega-3 Fatty Acids (FISH OIL) 500 MG CAPS, Take 1 capsule by mouth daily., Disp: , Rfl:  .  ranitidine (ZANTAC) 75 MG tablet, Take 75 mg by mouth daily., Disp: , Rfl:  .  sildenafil (VIAGRA) 100 MG tablet, Take 100 mg by mouth as needed for erectile  dysfunction., Disp: , Rfl:  .  therapeutic multivitamin-minerals (THERAGRAN-M) tablet, Take 1 tablet by mouth daily., Disp: , Rfl:  .  vitamin C (ASCORBIC ACID) 500 MG tablet, Take 500 mg by mouth daily., Disp: , Rfl:  .  MINERAL OIL LIGHT PO, Take by mouth daily., Disp: , Rfl:   No Known Allergies  Review of Systems  Constitutional: Negative for fever, chills and weight loss.  HENT: Negative for congestion, hearing loss, sore throat and tinnitus.   Eyes: Negative for blurred vision, double vision and redness.  Respiratory: Negative for cough, hemoptysis and shortness of breath.   Cardiovascular: Negative for chest pain, palpitations, orthopnea, claudication and leg swelling.  Gastrointestinal: Negative for heartburn, nausea, vomiting, diarrhea, constipation and blood in stool.  Genitourinary: Negative for dysuria, urgency, frequency and hematuria.  Musculoskeletal: Negative for myalgias, back pain, joint pain, falls and neck pain.  Skin: Negative for itching.  Neurological: Negative for dizziness, tingling, tremors, focal weakness, seizures, loss of consciousness, weakness and headaches.  Endo/Heme/Allergies: Does not bruise/bleed easily.  Psychiatric/Behavioral: Negative for depression and substance abuse. The patient is not nervous/anxious and does not have insomnia.  Objective  Filed Vitals:   08/30/14 0930  BP: 118/68  Pulse: 77  Temp: 97.7 F (36.5 C)  Resp: 16  Height: 5\' 11"  (1.803 m)  Weight: 204 lb (92.534 kg)  SpO2: 98%     Physical Exam  Constitutional: He is oriented to person, place, and time and well-developed, well-nourished, and in no distress.  HENT:  Head: Normocephalic.  Eyes: EOM are normal. Pupils are equal, round, and reactive to light.  Neck: Normal range of motion. Neck supple. No thyromegaly present.  Cardiovascular: Normal rate, regular rhythm and normal heart sounds.   No murmur heard. Pulmonary/Chest: Effort normal and breath sounds  normal. No respiratory distress. He has no wheezes.  Abdominal: Soft. Bowel sounds are normal.  Musculoskeletal: Normal range of motion. He exhibits no edema.  Lymphadenopathy:    He has no cervical adenopathy.  Neurological: He is alert and oriented to person, place, and time. No cranial nerve deficit. Gait normal. Coordination normal.  Skin: Skin is warm and dry. No rash noted.  Psychiatric: Affect and judgment normal.      Assessment & Plan  1. Annual physical exam  - CBC with Differential/Platelet - POC Hemoccult Bld/Stl (1-Cd Office Dx) - Comprehensive metabolic panel - Lipid panel - POC Hemoccult Bld/Stl (3-Cd Home Screen); Future - TSH  2. Osteopenia  - DG Bone Density; Future  3. Hypogonadism in male Followed by urologist and endocrinologist

## 2014-08-31 ENCOUNTER — Other Ambulatory Visit: Payer: Self-pay | Admitting: Family Medicine

## 2014-08-31 LAB — CBC WITH DIFFERENTIAL/PLATELET
BASOS: 1 %
Basophils Absolute: 0 10*3/uL (ref 0.0–0.2)
EOS (ABSOLUTE): 0.4 10*3/uL (ref 0.0–0.4)
Eos: 6 %
Hematocrit: 40.3 % (ref 37.5–51.0)
Hemoglobin: 13.6 g/dL (ref 12.6–17.7)
IMMATURE GRANULOCYTES: 0 %
Immature Grans (Abs): 0 10*3/uL (ref 0.0–0.1)
LYMPHS: 29 %
Lymphocytes Absolute: 1.8 10*3/uL (ref 0.7–3.1)
MCH: 29.5 pg (ref 26.6–33.0)
MCHC: 33.7 g/dL (ref 31.5–35.7)
MCV: 87 fL (ref 79–97)
MONOS ABS: 0.7 10*3/uL (ref 0.1–0.9)
Monocytes: 11 %
NEUTROS ABS: 3.4 10*3/uL (ref 1.4–7.0)
Neutrophils: 53 %
PLATELETS: 225 10*3/uL (ref 150–379)
RBC: 4.61 x10E6/uL (ref 4.14–5.80)
RDW: 14.1 % (ref 12.3–15.4)
WBC: 6.4 10*3/uL (ref 3.4–10.8)

## 2014-09-01 LAB — COMPREHENSIVE METABOLIC PANEL
A/G RATIO: 2 (ref 1.1–2.5)
ALT: 15 IU/L (ref 0–44)
AST: 24 IU/L (ref 0–40)
Albumin: 4.5 g/dL (ref 3.6–4.8)
Alkaline Phosphatase: 47 IU/L (ref 39–117)
BILIRUBIN TOTAL: 0.5 mg/dL (ref 0.0–1.2)
BUN / CREAT RATIO: 17 (ref 10–22)
BUN: 20 mg/dL (ref 8–27)
CO2: 24 mmol/L (ref 18–29)
Calcium: 9.2 mg/dL (ref 8.6–10.2)
Chloride: 100 mmol/L (ref 97–108)
Creatinine, Ser: 1.21 mg/dL (ref 0.76–1.27)
GFR calc Af Amer: 73 mL/min/{1.73_m2} (ref >59)
GFR calc non Af Amer: 63 mL/min/{1.73_m2} (ref >59)
Globulin, Total: 2.3 g/dL (ref 1.5–4.5)
Glucose: 101 mg/dL — ABNORMAL HIGH (ref 65–99)
Potassium: 4.5 mmol/L (ref 3.5–5.2)
Sodium: 140 mmol/L (ref 134–144)
TOTAL PROTEIN: 6.8 g/dL (ref 6.0–8.5)

## 2014-09-01 LAB — LIPID PANEL
CHOLESTEROL TOTAL: 146 mg/dL (ref 100–199)
Chol/HDL Ratio: 2.6 ratio units (ref 0.0–5.0)
HDL: 56 mg/dL (ref >39)
LDL CALC: 67 mg/dL (ref 0–99)
Triglycerides: 114 mg/dL (ref 0–149)

## 2014-09-01 LAB — TSH: TSH: 2.31 u[IU]/mL (ref 0.450–4.500)

## 2014-09-06 ENCOUNTER — Ambulatory Visit: Payer: Self-pay | Admitting: Family Medicine

## 2014-09-14 ENCOUNTER — Telehealth: Payer: Self-pay | Admitting: Emergency Medicine

## 2014-09-14 NOTE — Telephone Encounter (Signed)
Patient notified

## 2014-09-15 ENCOUNTER — Other Ambulatory Visit: Payer: Self-pay

## 2014-09-15 DIAGNOSIS — Z0000 Encounter for general adult medical examination without abnormal findings: Secondary | ICD-10-CM

## 2014-09-15 DIAGNOSIS — K602 Anal fissure, unspecified: Secondary | ICD-10-CM

## 2014-09-15 DIAGNOSIS — K625 Hemorrhage of anus and rectum: Secondary | ICD-10-CM

## 2014-09-15 LAB — POC HEMOCCULT BLD/STL (HOME/3-CARD/SCREEN)
Card #2 Fecal Occult Blod, POC: NEGATIVE
FECAL OCCULT BLD: NEGATIVE
FECAL OCCULT BLD: NEGATIVE

## 2014-10-11 ENCOUNTER — Other Ambulatory Visit: Payer: BLUE CROSS/BLUE SHIELD

## 2014-10-11 ENCOUNTER — Ambulatory Visit (INDEPENDENT_AMBULATORY_CARE_PROVIDER_SITE_OTHER): Payer: BLUE CROSS/BLUE SHIELD | Admitting: Internal Medicine

## 2014-10-11 VITALS — BP 102/64 | HR 68 | Temp 97.4°F | Resp 12 | Wt 201.8 lb

## 2014-10-11 DIAGNOSIS — D352 Benign neoplasm of pituitary gland: Secondary | ICD-10-CM

## 2014-10-11 DIAGNOSIS — E229 Hyperfunction of pituitary gland, unspecified: Secondary | ICD-10-CM

## 2014-10-11 DIAGNOSIS — E221 Hyperprolactinemia: Secondary | ICD-10-CM | POA: Insufficient documentation

## 2014-10-11 DIAGNOSIS — E291 Testicular hypofunction: Secondary | ICD-10-CM | POA: Diagnosis not present

## 2014-10-11 MED ORDER — CABERGOLINE 0.5 MG PO TABS: 0.2500 mg | ORAL_TABLET | ORAL | Status: DC

## 2014-10-11 NOTE — Progress Notes (Signed)
Patient ID: Zachary Hayden, male   DOB: 1950/03/24, 64 y.o.   MRN: [id number]   HPI  Zachary Hayden is a 63 y.o.-year-old male, referred by his PCP, Zachary Norris, MD, for management of a prolactinoma and intermittent hypogonadotropic hypogonadism when prolactin elevated. Patient has been previously seen by Zachary Hayden, who now left the practice.  Reviewed patient's pituitary tumor history - per his report and Dr. Boyd Hayden note: Records reviewed from [facility] and patient records. Synopsis is below. Prior hx hyperprolactinemia since 2000. He was first diagnosed  On imaging in 2005 when his prolactin was 960, and MRI showed a 15 mmx 36mm x 20 mm lesion. He has had good suppression of his prolactin with bromocriptine, but when it was tried to be tapered off after 3 years 06/2009, his prolactin levels started to rise in normal range. Otherwise it has been well suppressed . He was on BCR 2.5 twice daily, no problems with fatigue,nausea, LH, or headaches. No breast discharge. Has stable mild enlargement of the breasts bilaterally without any masses.  For his pituitary tumor, it has not shown growth but neither has it shrunk on the medication. Last MRI 2016, no change. No vision loss or headaches. Last visual field test 11/2013, Zachary Hayden at [facility]. Goes annually. No changes in VF testing per patient report. No change in shoe size. No steroid use. No LH/N/abdominal pain or hypotension.  Additionally, he had hypogonadism when the prolactin was high, which recovered when the prolactin was suppressed. He also developed prostate cancer treated with prostatectomy, so  He has not been supplemented for his testosterone, but it has continued to improve within the normal range, so that has not been necessary.He has a good sense of libido, but mild erectile dysfunction. In the past, he has noticed the significant decrease in libido when his prolactin has risen and testosterone has declined. He reports no new  medical problems. Reports that prostrate cancer is in remission. Having occasional hot flashes , no muscle weakness.   Lab and imaging tests review: 11/1998- PRL 548.8, TSH 1.91, T4 8.6 2001-PRL 660 01/2003- FSH<0.3, LH <0.3, PRL 939 MRI Jan 2005, [facility]- enlarged pituitary gland slight to right of midline, homogenous enhancement post contrast, no stalk deviation, no impingement on optic chiasm, no narrowing of cavernous sinus, ICA; tumor 1.5 x1.5 x2cm, enhancing nodule within pituitary as well 04/2003- TSH 2.69, free T4 0.95, PRL 862, cortisol 13.4, Testosterone 298 ( 179-756) PTH 54, IGF-1 178 ( 90-360) October 2005 - Testosterone low at 20- was on Androgel at that time 01/2004- PRL 9 04/2004- PRL 6, Testosterone 354  MRI Dec 2006- 62mm pituitary mass, decreased T1 signal , increased T2 and hypoenhancement right aspect of sella, displacing pituitary to left 02/2005- PRL 3, Testosterone 257 02/2006-PRL 5, Testosterone 318 03/2008 PRL 3, Testosterone 278 02/2009- PRL 5, Testosterone 310>>dose reduced to bromocriptine 2.5 mg daily 05/2009 -PRL 7, Testos 271 10/2009-PRL 10 03/2010 -PRL 9>>dose increased to 5mg  daily 03/2011-PRL 3, Testos 330  MRI pituitary 01/2012, [facility]- compared to 2010 1.5 x 1.1 cm enhacing mass in the right sella involving right cavernous sinus and displacing pitiutary stalk to left (grossly unchanged appearance as compared to prior)  02/2012-PRL 6, Testos 329  Last VF 2015 >> normal.  New MRI (04/19/2014): stable prolactinoma within right side of pituitary ( 1.5 x1.2 x1.2 cm) compared to 2013 scan. There was mild left deviation of the pituitary stalk, unchanged.  2-3 years ago >> tried off Bromocriptine >>  PRL increased.   Now restarted cabergoline 0.25 mg twice a week 4 months ago. A prolactin level obtained a month after starting cabergoline was low:  Lab Results  Component Value Date   PROLACTIN 3.6 07/07/2014   Latest testosterone: Testosterone: 205 (179-756), am,  fasting  Energy and strength are OK. He lifts weights. Has mild gynecomastia. No tension in breasts. He denies: - headaches - breast discomfort - galactorrhea  He also has a history of hyperlipidemia, GERD, prostate cancer.  ROS: Constitutional: no weight gain/loss, no fatigue, + hot flushes Eyes: no blurry vision, no xerophthalmia ENT: no sore throat, + nodules palpated in throat, no dysphagia/odynophagia, no hoarseness, + decreased hearing, + tinnitus Cardiovascular: no CP/SOB/palpitations/leg swelling Respiratory: no cough/SOB Gastrointestinal: no N/V/D/C, + acid reflux  Musculoskeletal: no muscle/joint aches Skin: no rashes Neurological: no tremors/numbness/tingling/dizziness Psychiatric: no depression/anxiety  Past Medical History  Diagnosis Date  . Pituitary adenoma 1992  . Hyperlipidemia   . Allergy   . Cancer 2005    prostate  . Prostate cancer 2005  . Colon polyp 2012    3 polyps  . Internal hemorrhoids    Past Surgical History  Procedure Laterality Date  . Prostate surgery  2005    radical  . Prostatectomy    . Colonoscopy  2010, 2012, 2014    Zachary Hayden   Social History   Social History  . Marital Status: Married    Spouse Name: N/A  . Number of Children: 4   Occupational History  .  [employer]   Social History Main Topics  . Smoking status: Never Smoker   . Smokeless tobacco: Never Used  . Alcohol Use: 0.0 oz/week    0 Standard drinks or equivalent per week     Comment: glass of wine monthly   . Drug Use: No   Current Outpatient Prescriptions on File Prior to Visit  Medication Sig Dispense Refill  . aspirin EC 81 MG tablet Take 81 mg by mouth daily.    Marland Kitchen atorvastatin (LIPITOR) 20 MG tablet Take 1 tablet (20 mg total) by mouth daily. 30 tablet 3  . cabergoline (DOSTINEX) 0.5 MG tablet Take 0.5 tablets (0.25 mg total) by mouth 2 (two) times a week. 10 tablet 3  . calcium carbonate (OS-CAL) 600 MG TABS tablet Take 600 mg by mouth.    .  diphenhydrAMINE (BENADRYL) 25 MG tablet Take 25 mg by mouth daily.    Marland Kitchen docusate sodium (COLACE) 100 MG capsule Take 100 mg by mouth 2 (two) times daily.    . fenofibrate 160 MG tablet Take 1 tablet (160 mg total) by mouth daily. 30 tablet 3  . flunisolide (NASALIDE) 25 MCG/ACT (0.025%) SOLN Place 1 spray into the nose daily. 1 Bottle 1  . hydrocortisone-pramoxine (ANALPRAM HC) 2.5-1 % rectal cream Place 1 application rectally 3 (three) times daily. 30 g 0  . MINERAL OIL LIGHT PO Take by mouth daily.    . niacin 500 MG tablet Take 500 mg by mouth at bedtime.    . Omega-3 Fatty Acids (FISH OIL) 500 MG CAPS Take 1 capsule by mouth daily.    . ranitidine (ZANTAC) 75 MG tablet Take 75 mg by mouth daily.    . sildenafil (VIAGRA) 100 MG tablet Take 100 mg by mouth as needed for erectile dysfunction.    Marland Kitchen therapeutic multivitamin-minerals (THERAGRAN-M) tablet Take 1 tablet by mouth daily.    . vitamin C (ASCORBIC ACID) 500 MG tablet Take 500 mg by mouth daily.  No current facility-administered medications on file prior to visit.   No Known Allergies Family History  Problem Relation Age of Onset  . Heart disease Father   . Hyperlipidemia Father   . Hypertension Father   . Other Cousin    PE: BP 102/64 mmHg  Pulse 68  Temp(Src) 97.4 F (36.3 C) (Oral)  Resp 12  Wt 201 lb 12.8 oz (91.536 kg)  SpO2 97% Body mass index is 28.16 kg/(m^2). Wt Readings from Last 3 Encounters:  10/11/14 201 lb 12.8 oz (91.536 kg)  08/30/14 204 lb (92.534 kg)  07/07/14 205 lb 8 oz (93.214 kg)   Constitutional: overweight, in NAD Eyes: PERRLA, EOMI, no exophthalmos ENT: moist mucous membranes, no thyromegaly, no cervical lymphadenopathy Cardiovascular: RRR, No MRG Respiratory: CTA B Gastrointestinal: abdomen soft, NT, ND, BS+ Musculoskeletal: no deformities, strength intact in all 4 Skin: moist, warm, no rashes Neurological: no tremor with outstretched hands, DTR normal in all 4  ASSESSMENT: 1.  Pituitary tumor  2. Prolactinoma  3. Hypogonadotropic hypogonadism  PLAN:  1. And 2. Patient with h/o prolactinoma, diagnosed 16 years ago, stable in size, with previous recurrence after stopping dopamine agonist tx. He is now using cabergoline 0.25 mg twice a week, with a prolactin level of 3.6 obtained a month after starting cabergoline. We discussed about the fact that the level is very low, which is great, and indicates that we can reduce the dose of cabergoline to 0.25 mg once a week. We need to repeat the prolactin level 2 months after this change. He agrees with this change. - We also discussed about the fact that there is no need to obtain another MRI if the prolactin levels are controlled. We reviewed his MRI reports (I do not have access the images), and they show stable size of the prolactinoma throughout the years, including on the MRI obtained in 04/2014 - We also discussed that he does not need further visual fields performed yearly if there is no growth in the adenoma - I will see the patient back in a year  3. Hypogonadotropic hypogonadism - We reviewed his latest testosterone level as obtained by his urologist >> normal  - time spent with the patient: 40 minutes, of which >50% was spent in obtaining information about his symptoms, reviewing his previous labs, evaluations, and treatments, counseling him about his condition (please see the discussed topics above), and developing a plan to further investigate it; he had a number of questions which I addressed.  CC: Urologist, Dr. Yves Hayden  Lab on 10/11/2014  Component Date Value Ref Range Status  . Prolactin 10/11/2014 2.4  2.1 - 17.1 ng/mL Final   Comment:      Reference Ranges:                  Male:                       2.1 -  17.1 ng/ml                  Male:   Pregnant          9.7 - 208.5 ng/mL                            Non Pregnant      2.8 -  29.2 ng/mL  Post Menopausal   1.8 -  20.3 ng/mL                        PRL is even lower than before >> continur with plan to decrease Cabergoline dose.

## 2014-10-11 NOTE — Patient Instructions (Signed)
Please stop at the lab.  Please decrease the dose of Cabergoline to 0.25 mg once a week.  Let's repeat the Prolactin in 2 more months to check the Prolactin dose.

## 2014-10-12 ENCOUNTER — Encounter: Payer: Self-pay | Admitting: Internal Medicine

## 2014-10-12 LAB — PROLACTIN: Prolactin: 2.4 ng/mL (ref 2.1–17.1)

## 2014-10-19 ENCOUNTER — Encounter: Payer: Self-pay | Admitting: Family Medicine

## 2014-10-19 ENCOUNTER — Ambulatory Visit (INDEPENDENT_AMBULATORY_CARE_PROVIDER_SITE_OTHER): Payer: BLUE CROSS/BLUE SHIELD | Admitting: Family Medicine

## 2014-10-19 VITALS — BP 110/72 | HR 83 | Temp 99.0°F | Resp 18 | Ht 71.0 in | Wt 197.9 lb

## 2014-10-19 DIAGNOSIS — R509 Fever, unspecified: Secondary | ICD-10-CM | POA: Diagnosis not present

## 2014-10-19 DIAGNOSIS — J40 Bronchitis, not specified as acute or chronic: Secondary | ICD-10-CM | POA: Diagnosis not present

## 2014-10-19 DIAGNOSIS — J0100 Acute maxillary sinusitis, unspecified: Secondary | ICD-10-CM

## 2014-10-19 DIAGNOSIS — J029 Acute pharyngitis, unspecified: Secondary | ICD-10-CM | POA: Diagnosis not present

## 2014-10-19 LAB — POCT RAPID STREP A (OFFICE): Rapid Strep A Screen: NEGATIVE

## 2014-10-19 LAB — POCT INFLUENZA A/B
INFLUENZA A, POC: NEGATIVE
INFLUENZA B, POC: NEGATIVE

## 2014-10-19 MED ORDER — AMOXICILLIN-POT CLAVULANATE 875-125 MG PO TABS: 1.0000 | ORAL_TABLET | Freq: Two times a day (BID) | ORAL | Status: DC

## 2014-10-19 NOTE — Progress Notes (Signed)
Name: Zachary Hayden   MRN: [id number]    DOB: [birth year]   Date:10/19/2014       Progress Note  Subjective  Chief Complaint  Chief Complaint  Patient presents with  . Fever    99 to 100  . Sore Throat    for 6 days    HPI  Fever and sore throat.  Patient presents with a 6 day history of sore throat pain and fever as high as 102. The nasal discharge which is purulent S times as well as purulent sputum production. There's been no history of any tick bites and there's been no complaint of headache pain. There is some pharyngitis present.    Past Medical History  Diagnosis Date  . Pituitary adenoma 1992  . Hyperlipidemia   . Allergy   . Cancer 2005    prostate  . Prostate cancer 2005  . Colon polyp 2012    3 polyps  . Internal hemorrhoids     Social History  Substance Use Topics  . Smoking status: Never Smoker   . Smokeless tobacco: Never Used  . Alcohol Use: 0.0 oz/week    0 Standard drinks or equivalent per week     Comment: glass of wine     Current outpatient prescriptions:  .  aspirin EC 81 MG tablet, Take 81 mg by mouth daily., Disp: , Rfl:  .  atorvastatin (LIPITOR) 20 MG tablet, Take 1 tablet (20 mg total) by mouth daily., Disp: 30 tablet, Rfl: 3 .  cabergoline (DOSTINEX) 0.5 MG tablet, Take 0.5 tablets (0.25 mg total) by mouth once a week., Disp: 10 tablet, Rfl: 3 .  calcium carbonate (OS-CAL) 600 MG TABS tablet, Take 600 mg by mouth., Disp: , Rfl:  .  diphenhydrAMINE (BENADRYL) 25 MG tablet, Take 25 mg by mouth daily., Disp: , Rfl:  .  docusate sodium (COLACE) 100 MG capsule, Take 100 mg by mouth 2 (two) times daily., Disp: , Rfl:  .  fenofibrate 160 MG tablet, Take 1 tablet (160 mg total) by mouth daily., Disp: 30 tablet, Rfl: 3 .  flunisolide (NASALIDE) 25 MCG/ACT (0.025%) SOLN, Place 1 spray into the nose daily., Disp: 1 Bottle, Rfl: 1 .  hydrocortisone-pramoxine (ANALPRAM HC) 2.5-1 % rectal cream, Place 1 application rectally 3 (three) times daily.,  Disp: 30 g, Rfl: 0 .  MINERAL OIL LIGHT PO, Take by mouth daily., Disp: , Rfl:  .  niacin 500 MG tablet, Take 500 mg by mouth at bedtime., Disp: , Rfl:  .  Omega-3 Fatty Acids (FISH OIL) 500 MG CAPS, Take 1 capsule by mouth daily., Disp: , Rfl:  .  ranitidine (ZANTAC) 75 MG tablet, Take 75 mg by mouth daily., Disp: , Rfl:  .  sildenafil (VIAGRA) 100 MG tablet, Take 100 mg by mouth as needed for erectile dysfunction., Disp: , Rfl:  .  therapeutic multivitamin-minerals (THERAGRAN-M) tablet, Take 1 tablet by mouth daily., Disp: , Rfl:  .  vitamin C (ASCORBIC ACID) 500 MG tablet, Take 500 mg by mouth daily., Disp: , Rfl:   No Known Allergies  Review of Systems  Constitutional: Positive for fever, chills and malaise/fatigue. Negative for weight loss.  HENT: Positive for sore throat. Negative for congestion, hearing loss and tinnitus.   Eyes: Negative for blurred vision, double vision and redness.  Respiratory: Positive for cough and sputum production. Negative for hemoptysis and shortness of breath.   Cardiovascular: Negative for chest pain, palpitations, orthopnea, claudication and leg swelling.  Gastrointestinal:  Negative for heartburn, nausea, vomiting, diarrhea, constipation and blood in stool.  Genitourinary: Negative for dysuria, urgency, frequency and hematuria.  Musculoskeletal: Positive for myalgias. Negative for back pain, joint pain, falls and neck pain.  Skin: Negative for itching.  Neurological: Negative for dizziness, tingling, tremors, focal weakness, seizures, loss of consciousness, weakness and headaches.  Endo/Heme/Allergies: Does not bruise/bleed easily.  Psychiatric/Behavioral: Negative for depression and substance abuse. The patient is not nervous/anxious and does not have insomnia.      Objective  Filed Vitals:   10/19/14 0747  BP: 110/72  Pulse: 83  Temp: 99 F (37.2 C)  TempSrc: Oral  Resp: 18  Height: 5\' 11"  (1.803 m)  Weight: 197 lb 14.4 oz (89.767 kg)   SpO2: 97%     Physical Exam  Constitutional: He is oriented to person, place, and time and well-developed, well-nourished, and in no distress.  HENT:  Head: Normocephalic.  Disturbance and erythematous with clear discharge pharynx minimally injected  Eyes: EOM are normal. Pupils are equal, round, and reactive to light.  Neck: Normal range of motion. Neck supple. No thyromegaly present.  Cardiovascular: Normal rate, regular rhythm and normal heart sounds.   No murmur heard. Pulmonary/Chest: Effort normal and breath sounds normal. No respiratory distress. He has no wheezes.  Abdominal: Soft. Bowel sounds are normal.  Musculoskeletal: Normal range of motion. He exhibits no edema.  Lymphadenopathy:    He has no cervical adenopathy.  Neurological: He is alert and oriented to person, place, and time. No cranial nerve deficit. Gait normal. Coordination normal.  Skin: Skin is warm and dry. No rash noted.  Psychiatric: Affect and judgment normal.      Assessment & Plan  1. Acute maxillary sinusitis, recurrence not specified  - amoxicillin-clavulanate (AUGMENTIN) 875-125 MG per tablet; Take 1 tablet by mouth 2 (two) times daily.  Dispense: 20 tablet; Refill: 0  2. Bronchitis Delsym OTC when necessary  3. Sore thrsymptomatic treatment - POCT rapid strep A  4. Fever, unspecified fever causymptomatic treatment - POCT Influenza A/B    His

## 2014-10-20 ENCOUNTER — Telehealth: Payer: Self-pay | Admitting: Family Medicine

## 2014-10-20 MED ORDER — DOXYCYCLINE HYCLATE 100 MG PO CAPS: 100.0000 mg | ORAL_CAPSULE | Freq: Two times a day (BID) | ORAL | Status: DC

## 2014-10-20 NOTE — Telephone Encounter (Signed)
PT SAID THAT THE DR HAD ASKED IF HE HAD A RASH BUT AT TIME OF VISIT YESTERDAY HE DID NOT BUT NOW THIS MORNING HE DOES IN THE BUTT AREA. DID NOT KNOW IF THAT MADE ANY DIFFERENCE.

## 2014-10-20 NOTE — Telephone Encounter (Signed)
CHANGE ABX TO VIBRAMYCIN 100 MG BID FOR 21 DAYS TO COVER RMSF STOP AUGMENTIN, BUT DO NOT THROW AWAY RTO IN 3 WK LAB FOR RMSF AND LYME DZ

## 2014-10-21 ENCOUNTER — Encounter: Payer: Self-pay | Admitting: Family Medicine

## 2014-11-09 ENCOUNTER — Telehealth: Payer: Self-pay | Admitting: Emergency Medicine

## 2014-11-09 DIAGNOSIS — T732XXA Exhaustion due to exposure, initial encounter: Secondary | ICD-10-CM

## 2014-11-09 DIAGNOSIS — R21 Rash and other nonspecific skin eruption: Secondary | ICD-10-CM

## 2014-11-09 DIAGNOSIS — R509 Fever, unspecified: Secondary | ICD-10-CM

## 2014-11-09 NOTE — Telephone Encounter (Signed)
Lab slip up front for patient to pick up

## 2014-11-11 ENCOUNTER — Ambulatory Visit (INDEPENDENT_AMBULATORY_CARE_PROVIDER_SITE_OTHER): Payer: BLUE CROSS/BLUE SHIELD

## 2014-11-11 DIAGNOSIS — Z23 Encounter for immunization: Secondary | ICD-10-CM | POA: Diagnosis not present

## 2014-11-15 LAB — LYME DISEASE, WESTERN BLOT
IGG P23 AB.: ABSENT
IGG P66 AB.: ABSENT
IGM P23 AB.: ABSENT
IGM P39 AB.: ABSENT
IGM P41 AB.: ABSENT
IgG P28 Ab.: ABSENT
IgG P30 Ab.: ABSENT
IgG P39 Ab.: ABSENT
IgG P45 Ab.: ABSENT
IgG P58 Ab.: ABSENT
IgG P93 Ab.: ABSENT
LYME IGM WB: NEGATIVE
Lyme IgG Wb: NEGATIVE

## 2014-11-15 LAB — ROCKY MTN SPOTTED FVR AB, IGG-BLOOD: RMSF IgG: UNDETERMINED

## 2014-11-15 LAB — RMSF, IGG, IFA

## 2014-11-22 ENCOUNTER — Telehealth: Payer: Self-pay | Admitting: Emergency Medicine

## 2014-11-22 NOTE — Telephone Encounter (Signed)
Patient notified

## 2014-12-19 ENCOUNTER — Other Ambulatory Visit: Payer: Self-pay | Admitting: Family Medicine

## 2014-12-27 ENCOUNTER — Other Ambulatory Visit (INDEPENDENT_AMBULATORY_CARE_PROVIDER_SITE_OTHER): Payer: BLUE CROSS/BLUE SHIELD

## 2014-12-27 ENCOUNTER — Encounter: Payer: Self-pay | Admitting: Internal Medicine

## 2014-12-27 DIAGNOSIS — D352 Benign neoplasm of pituitary gland: Secondary | ICD-10-CM

## 2014-12-28 ENCOUNTER — Encounter: Payer: Self-pay | Admitting: Internal Medicine

## 2014-12-28 ENCOUNTER — Other Ambulatory Visit: Payer: Self-pay | Admitting: *Deleted

## 2014-12-28 LAB — PROLACTIN: PROLACTIN: 4.3 ng/mL (ref 2.1–17.1)

## 2014-12-28 MED ORDER — CABERGOLINE 0.5 MG PO TABS: 0.2500 mg | ORAL_TABLET | ORAL | Status: DC

## 2014-12-29 ENCOUNTER — Other Ambulatory Visit: Payer: Self-pay | Admitting: Family Medicine

## 2015-01-02 ENCOUNTER — Encounter: Payer: Self-pay | Admitting: Family Medicine

## 2015-01-15 ENCOUNTER — Other Ambulatory Visit: Payer: Self-pay | Admitting: Family Medicine

## 2015-03-07 ENCOUNTER — Ambulatory Visit: Payer: BLUE CROSS/BLUE SHIELD | Admitting: Family Medicine

## 2015-03-14 ENCOUNTER — Other Ambulatory Visit: Payer: Self-pay | Admitting: Family Medicine

## 2015-03-14 ENCOUNTER — Ambulatory Visit: Payer: BLUE CROSS/BLUE SHIELD | Admitting: Family Medicine

## 2015-03-15 ENCOUNTER — Ambulatory Visit: Payer: BLUE CROSS/BLUE SHIELD | Admitting: Family Medicine

## 2015-03-15 ENCOUNTER — Ambulatory Visit (INDEPENDENT_AMBULATORY_CARE_PROVIDER_SITE_OTHER): Payer: Medicare HMO | Admitting: Family Medicine

## 2015-03-15 ENCOUNTER — Encounter: Payer: Self-pay | Admitting: Family Medicine

## 2015-03-15 VITALS — BP 126/80 | HR 85 | Temp 98.1°F | Resp 16 | Ht 71.0 in | Wt 200.7 lb

## 2015-03-15 DIAGNOSIS — E785 Hyperlipidemia, unspecified: Secondary | ICD-10-CM | POA: Diagnosis not present

## 2015-03-15 DIAGNOSIS — K602 Anal fissure, unspecified: Secondary | ICD-10-CM | POA: Diagnosis not present

## 2015-03-15 DIAGNOSIS — J309 Allergic rhinitis, unspecified: Secondary | ICD-10-CM | POA: Insufficient documentation

## 2015-03-15 DIAGNOSIS — E229 Hyperfunction of pituitary gland, unspecified: Secondary | ICD-10-CM | POA: Diagnosis not present

## 2015-03-15 DIAGNOSIS — E221 Hyperprolactinemia: Secondary | ICD-10-CM

## 2015-03-15 DIAGNOSIS — R0982 Postnasal drip: Secondary | ICD-10-CM

## 2015-03-15 DIAGNOSIS — J45909 Unspecified asthma, uncomplicated: Secondary | ICD-10-CM

## 2015-03-15 DIAGNOSIS — E781 Pure hyperglyceridemia: Secondary | ICD-10-CM

## 2015-03-15 MED ORDER — HYDROCORTISONE ACE-PRAMOXINE 2.5-1 % RE CREA: 1.0000 "application " | TOPICAL_CREAM | Freq: Three times a day (TID) | RECTAL | Status: DC

## 2015-03-15 MED ORDER — ATORVASTATIN CALCIUM 20 MG PO TABS: 20.0000 mg | ORAL_TABLET | Freq: Every day | ORAL | Status: DC

## 2015-03-15 MED ORDER — FLUNISOLIDE 25 MCG/ACT (0.025%) NA SOLN: 1.0000 | Freq: Two times a day (BID) | NASAL | Status: DC

## 2015-03-15 MED ORDER — FENOFIBRATE 160 MG PO TABS: 160.0000 mg | ORAL_TABLET | Freq: Every day | ORAL | Status: DC

## 2015-03-15 NOTE — Progress Notes (Signed)
Name: Zachary Hayden   MRN: [id number]    DOB: Apr 19, 1950   Date:03/15/2015       Progress Note  Subjective  Chief Complaint  Chief Complaint  Patient presents with  . Medication Refill    6 month f/u  . Hyperlipidemia    HPI  Zachary Hayden. III Hayden is a 65 year old patient of Dr. Serita Grit [facility]y's. Due to his PCP being out of the office unexpectedly today he is here to see me to discuss refill of his medications. On review of his medical chart he has chronic conditions such HLD, Pituitary adenoma with hyperprolactinemia, h/o prostate cancer, Osteopenia, ED.  Patient reports no symptoms or concerns today and is tolerating medication regimen well. Last FLP 08/2014 good control. For his allergies he takes Nasalide spray which helps keep things open. For his Hypercholesterolemia with hypertriglyceridemia he takes atorvastatin 20mg  with fenofibrate 160 mg a day. Zachary Hayden reports still having some issues with rectal tears so is requesting refill on his Analpram cream as well.    Past Medical History  Diagnosis Date  . Pituitary adenoma ([place]) 1992  . Hyperlipidemia   . Allergy   . Cancer [facility]) 2005    prostate  . Prostate cancer ([place]) 2005  . Colon polyp 2012    3 polyps  . Internal hemorrhoids     Patient Active Problem List   Diagnosis Date Noted  . Secondary hyperprolactinemia due to prolactin-secreting tumor ([place]) 10/11/2014  . Annual physical exam 08/30/2014  . Osteopenia 08/30/2014  . Hypogonadism in male 08/30/2014  . Rectal bleeding 04/08/2014  . Anal fissure 04/08/2014  . HLD (hyperlipidemia) 03/17/2014  . Erectile dysfunction 03/17/2014  . Prolactinoma ([place]) 03/16/2014  . Hypertriglyceridemia 03/21/2011  . Hyperprolactinemia ([place]) 03/15/2010  . Partial hypopituitarism ([place]) 03/15/2010  . CA of prostate ([place]) 03/20/2006    Social History  Substance Use Topics  . Smoking status: Never Smoker   . Smokeless tobacco: Never Used  . Alcohol Use: 0.0 oz/week    0  Standard drinks or equivalent per week     Comment: glass of wine     Current outpatient prescriptions:  .  aspirin EC 81 MG tablet, Take 81 mg by mouth daily., Disp: , Rfl:  .  atorvastatin (LIPITOR) 20 MG tablet, TAKE ONE TABLET BY MOUTH EVERY DAY, Disp: 30 tablet, Rfl: 2 .  cabergoline (DOSTINEX) 0.5 MG tablet, Take 0.5 tablets (0.25 mg total) by mouth once a week., Disp: 10 tablet, Rfl: 2 .  calcium carbonate (OS-CAL) 600 MG TABS tablet, Take 600 mg by mouth., Disp: , Rfl:  .  diphenhydrAMINE (BENADRYL) 25 MG tablet, Take 25 mg by mouth daily., Disp: , Rfl:  .  docusate sodium (COLACE) 100 MG capsule, Take 100 mg by mouth 2 (two) times daily., Disp: , Rfl:  .  fenofibrate 160 MG tablet, TAKE ONE TABLET BY MOUTH EVERY DAY, Disp: 30 tablet, Rfl: 2 .  flunisolide (NASALIDE) 25 MCG/ACT (0.025%) SOLN, PLACE 1 SPRAY INTO THE NOSE EVERY DAY, Disp: 25 mL, Rfl: 1 .  hydrocortisone-pramoxine (ANALPRAM HC) 2.5-1 % rectal cream, Place 1 application rectally 3 (three) times daily., Disp: 30 g, Rfl: 0 .  Omega-3 Fatty Acids (FISH OIL) 500 MG CAPS, Take 1 capsule by mouth daily., Disp: , Rfl:  .  ranitidine (ZANTAC) 75 MG tablet, Take 75 mg by mouth daily., Disp: , Rfl:  .  sildenafil (VIAGRA) 100 MG tablet, Take 100 mg by mouth as needed  for erectile dysfunction., Disp: , Rfl:  .  therapeutic multivitamin-minerals (THERAGRAN-M) tablet, Take 1 tablet by mouth daily., Disp: , Rfl:  .  vitamin C (ASCORBIC ACID) 500 MG tablet, Take 500 mg by mouth daily., Disp: , Rfl:   Past Surgical History  Procedure Laterality Date  . Prostate surgery  2005    radical  . Prostatectomy    . Colonoscopy  2010, 2012, 2014    Dr. Dionne Milo    Family History  Problem Relation Age of Onset  . Heart disease Father   . Hyperlipidemia Father   . Hypertension Father   . Other Cousin     No Known Allergies   Review of Systems  CONSTITUTIONAL: No significant weight changes, fever, chills, weakness or fatigue.   HEENT:  - Eyes: No visual changes.  - Ears: No auditory changes. No pain.  - Nose: No sneezing, congestion, runny nose. - Throat: No sore throat. No changes in swallowing. SKIN: No rash or itching.  CARDIOVASCULAR: No chest pain, chest pressure or chest discomfort. No palpitations or edema.  RESPIRATORY: No shortness of breath, cough or sputum.  GASTROINTESTINAL: No anorexia, nausea, vomiting. No changes in bowel habits. No abdominal pain or blood.  GENITOURINARY: No dysuria. No frequency. No discharge. NEUROLOGICAL: No headache, dizziness, syncope, paralysis, ataxia, numbness or tingling in the extremities. No memory changes. No change in bowel or bladder control.  MUSCULOSKELETAL: No joint pain. No muscle pain. HEMATOLOGIC: No anemia, bleeding or bruising.  LYMPHATICS: No enlarged lymph nodes.  PSYCHIATRIC: No change in mood. No change in sleep pattern.  ENDOCRINOLOGIC: No reports of sweating, cold or heat intolerance. No polyuria or polydipsia.     Objective  BP 126/80 mmHg  Pulse 85  Temp(Src) 98.1 F (36.7 C) (Oral)  Resp 16  Ht 5\' 11"  (1.803 m)  Wt 200 lb 11.2 oz (91.037 kg)  BMI 28.00 kg/m2  SpO2 98% Body mass index is 28 kg/(m^2).  Physical Exam  Constitutional: Patient appears well-developed and well-nourished. In no distress.   Cardiovascular: Normal rate, regular rhythm and normal heart sounds.  No murmur heard.  Pulmonary/Chest: Effort normal and breath sounds normal. No respiratory distress. Musculoskeletal: Normal range of motion bilateral UE and LE, no joint effusions. Peripheral vascular: Bilateral LE no edema. Neurological: CN II-XII grossly intact with no focal deficits. Alert and oriented to person, place, and time. Coordination, balance, strength, speech and gait are normal.  Skin: Skin is warm and dry. No rash noted. No erythema.  Psychiatric: Patient has a normal mood and affect. Behavior is normal in office today. Judgment and thought content normal  in office today.   Recent Results (from the past 2160 hour(s))  Prolactin     Status: None   Collection Time: 12/27/14 10:09 AM  Result Value Ref Range   Prolactin 4.3 2.1 - 17.1 ng/mL    Comment:      Reference Ranges:                  Male:                       2.1 -  17.1 ng/ml                  Male:   Pregnant          9.7 - 208.5 ng/mL  Non Pregnant      2.8 -  29.2 ng/mL                            Post Menopausal   1.8 -  20.3 ng/mL                         Assessment & Plan  1. HLD (hyperlipidemia) Well controled on current regimen, reviewed lab work done 2016.  - atorvastatin (LIPITOR) 20 MG tablet; Take 1 tablet (20 mg total) by mouth daily.  Dispense: 90 tablet; Refill: 1  2. Secondary hyperprolactinemia due to prolactin-secreting tumor [facility]) Well controled, reviewed most recent lab results.   3. Anal fissure Refilled. F/U for CPE with PCP further physical assessment.   - hydrocortisone-pramoxine (ANALPRAM HC) 2.5-1 % rectal cream; Place 1 application rectally 3 (three) times daily.  Dispense: 30 g; Refill: 1  4. Hypertriglyceridemia Stable, Refilled  - fenofibrate 160 MG tablet; Take 1 tablet (160 mg total) by mouth daily.  Dispense: 90 tablet; Refill: 1  5. Allergic rhinitis with postnasal drip Stable, Refilled  - flunisolide (NASALIDE) 25 MCG/ACT (0.025%) SOLN; Place 1 spray into the nose 2 (two) times daily.  Dispense: 25 mL; Refill: 5

## 2015-03-21 ENCOUNTER — Ambulatory Visit: Payer: BLUE CROSS/BLUE SHIELD | Admitting: Endocrinology

## 2015-06-05 ENCOUNTER — Encounter: Payer: Self-pay | Admitting: Family Medicine

## 2015-06-12 ENCOUNTER — Other Ambulatory Visit: Payer: Self-pay | Admitting: Family Medicine

## 2015-09-13 ENCOUNTER — Encounter: Payer: Medicare HMO | Admitting: Family Medicine

## 2015-10-11 ENCOUNTER — Ambulatory Visit (INDEPENDENT_AMBULATORY_CARE_PROVIDER_SITE_OTHER): Payer: BLUE CROSS/BLUE SHIELD | Admitting: Internal Medicine

## 2015-10-11 ENCOUNTER — Encounter: Payer: Self-pay | Admitting: Internal Medicine

## 2015-10-11 VITALS — BP 124/82 | HR 67 | Wt 179.0 lb

## 2015-10-11 DIAGNOSIS — D352 Benign neoplasm of pituitary gland: Secondary | ICD-10-CM | POA: Diagnosis not present

## 2015-10-11 DIAGNOSIS — N62 Hypertrophy of breast: Secondary | ICD-10-CM | POA: Insufficient documentation

## 2015-10-11 DIAGNOSIS — E221 Hyperprolactinemia: Secondary | ICD-10-CM

## 2015-10-11 DIAGNOSIS — E229 Hyperfunction of pituitary gland, unspecified: Secondary | ICD-10-CM

## 2015-10-11 DIAGNOSIS — E291 Testicular hypofunction: Secondary | ICD-10-CM

## 2015-10-11 NOTE — Patient Instructions (Signed)
Please come back for labs fasting, at 8 am.  Continue Cabergoline 0.25 mg weekly, but move this at bedtime.  Please return in 1 year.

## 2015-10-11 NOTE — Progress Notes (Signed)
Patient ID: Zachary Hayden, male   DOB: [birth year], 65 y.o.   MRN: [id number]   HPI  Zachary Hayden is a 65 y.o.-year-old male, initially referred by his PCP, Ashok Norris, MD, for management of a prolactinoma and h/o hypogonadotropic hypogonadism. Last visit 1 year ago.  Reviewed patient's pituitary tumor history - per his report and Dr. Boyd Kerbs note: Records reviewed from [facility] and patient records. Synopsis is below. Prior hx hyperprolactinemia since 2000. He was first diagnosed  On imaging in 2005 when his prolactin was 960, and MRI showed a 15 mmx 86mm x 20 mm lesion. He has had good suppression of his prolactin with bromocriptine, but when it was tried to be tapered off after 3 years 06/2009, his prolactin levels started to rise in normal range. Otherwise it has been well suppressed . He was on BCR 2.5 twice daily, no problems with fatigue,nausea, LH, or headaches. No breast discharge. Has stable mild enlargement of the breasts bilaterally without any masses.  For his pituitary tumor, it has not shown growth but neither has it shrunk on the medication. Last MRI 2016, no change. No vision loss or headaches. Last visual field test 11/2013, Dr.Charles Sydnor at [facility]. Goes annually. No changes in VF testing per patient report. No change in shoe size. No steroid use.   Additionally, he had hypogonadism when the prolactin was high, which recovered when the prolactin was suppressed. He also developed prostate cancer treated with prostatectomy, so  He has not been supplemented for his testosterone, but it has continued to improve within the normal range, so that has not been necessary.He has a good sense of libido, but mild erectile dysfunction. In the past, he has noticed the significant decrease in libido when his prolactin has risen and testosterone has declined. He reports no new medical problems. Reports that prostate cancer is in remission. Having occasional hot flashes , no muscle  weakness.   Lab and imaging tests review: 11/1998- PRL 548.8, TSH 1.91, T4 8.6 2001-PRL 660 01/2003- FSH<0.3, LH <0.3, PRL 939 MRI Jan 2005, [facility]- enlarged pituitary gland slight to right of midline, homogenous enhancement post contrast, no stalk deviation, no impingement on optic chiasm, no narrowing of cavernous sinus, ICA; tumor 1.5 x1.5 x2cm, enhancing nodule within pituitary as well 04/2003- TSH 2.69, free T4 0.95, PRL 862, cortisol 13.4, Testosterone 298 ( 179-756) PTH 54, IGF-1 178 ( 90-360) October 2005 - Testosterone low at 20- was on Androgel at that time 01/2004- PRL 9 04/2004- PRL 6, Testosterone 354  MRI Dec 2006- 35mm pituitary mass, decreased T1 signal , increased T2 and hypoenhancement right aspect of sella, displacing pituitary to left 02/2005- PRL 3, Testosterone 257 02/2006-PRL 5, Testosterone 318 03/2008 PRL 3, Testosterone 278 02/2009- PRL 5, Testosterone 310>>dose reduced to bromocriptine 2.5 mg daily 05/2009 -PRL 7, Testos 271 10/2009-PRL 10 03/2010 -PRL 9>>dose increased to 5mg  daily 03/2011-PRL 3, Testos 330  MRI pituitary 01/2012, [facility]- compared to 2010 1.5 x 1.1 cm enhacing mass in the right sella involving right cavernous sinus and displacing pitiutary stalk to left (grossly unchanged appearance as compared to prior)  02/2012-PRL 6, Testosterone 329  Last VF 2015 >> normal.  New MRI (04/19/2014): stable prolactinoma within right side of pituitary ( 1.5 x1.2 x1.2 cm) compared to 2013 scan. There was mild left deviation of the pituitary stalk, unchanged.  2-3 years ago >> tried off Bromocriptine >> PRL increased.   We started Cabergoline 05/2014. He feels well on this,  with only hot flushes.  On cabergoline 0.25 mg once a week (decreasd 10/11/2014). A prolactin level obtained a month after starting cabergoline was still great: Lab Results  Component Value Date   PROLACTIN 4.3 12/27/2014   PROLACTIN 2.4 10/11/2014   PROLACTIN 3.6 07/07/2014   Latest  testosterone: 06/2015: Testosterone: 174 (before the weight loss) - nonfasting 03/2014: Testosterone: 205 (179-756), am, fasting  Energy and strength are OK. He lifts weights. Has mild gynecomastia. No tension in breasts.   He lost 21 lbs since last visit (intentional: diet and exercise).  He denies: - headaches - breast discomfort - galactorrhea  He also has a history of hyperlipidemia, GERD, prostate cancer.  ROS: Constitutional: + weight loss, no fatigue, + hot flushes, + occasional nocturia Eyes: no blurry vision, no xerophthalmia ENT: no sore throat, no nodules palpated in throat, no dysphagia/odynophagia, no hoarseness Cardiovascular: no CP/SOB/palpitations/leg swelling Respiratory: no cough/SOB Gastrointestinal: no N/V/D/C, + acid reflux  Musculoskeletal: no muscle/joint aches Skin: no rashes Neurological: no tremors/numbness/tingling/dizziness + diff wit erections; + enlarged breasts - long standing  Past Medical History:  Diagnosis Date  . Allergy   . Cancer [facility]) 2005   prostate  . Colon polyp 2012   3 polyps  . Hyperlipidemia   . Internal hemorrhoids   . Pituitary adenoma ([place]) 1992  . Prostate cancer ([place]) 2005   Past Surgical History:  Procedure Laterality Date  . COLONOSCOPY  2010, 2012, 2014   Dr. Dionne Milo  . PROSTATE SURGERY  2005   radical  . PROSTATECTOMY     Social History   Social History  . Marital Status: Married    Spouse Name: N/A  . Number of Children: 4   Occupational History  .  [employer]   Social History Main Topics  . Smoking status: Never Smoker   . Smokeless tobacco: Never Used  . Alcohol Use: 0.0 oz/week    0 Standard drinks or equivalent per week     Comment: glass of wine monthly   . Drug Use: No   Current Outpatient Prescriptions on File Prior to Visit  Medication Sig Dispense Refill  . aspirin EC 81 MG tablet Take 81 mg by mouth daily.    . cabergoline (DOSTINEX) 0.5 MG tablet Take 0.5 tablets (0.25 mg total) by  mouth once a week. 10 tablet 2  . calcium carbonate (OS-CAL) 600 MG TABS tablet Take 600 mg by mouth.    . diphenhydrAMINE (BENADRYL) 25 MG tablet Take 25 mg by mouth daily.    Marland Kitchen docusate sodium (COLACE) 100 MG capsule Take 100 mg by mouth 2 (two) times daily.    . flunisolide (NASALIDE) 25 MCG/ACT (0.025%) SOLN Place 1 spray into the nose 2 (two) times daily. 25 mL 5  . Omega-3 Fatty Acids (FISH OIL) 500 MG CAPS Take 1 capsule by mouth daily.    . ranitidine (ZANTAC) 75 MG tablet Take 150 mg by mouth daily.     . sildenafil (VIAGRA) 100 MG tablet Take 100 mg by mouth as needed for erectile dysfunction.    Marland Kitchen therapeutic multivitamin-minerals (THERAGRAN-M) tablet Take 1 tablet by mouth daily.    . vitamin C (ASCORBIC ACID) 500 MG tablet Take 500 mg by mouth daily.    Marland Kitchen atorvastatin (LIPITOR) 20 MG tablet Take 1 tablet (20 mg total) by mouth daily. (Patient not taking: Reported on 10/11/2015) 90 tablet 1  . fenofibrate 160 MG tablet Take 1 tablet (160 mg total) by mouth daily. (Patient not taking:  Reported on 10/11/2015) 90 tablet 1  . hydrocortisone-pramoxine (ANALPRAM HC) 2.5-1 % rectal cream Place 1 application rectally 3 (three) times daily. (Patient not taking: Reported on 10/11/2015) 30 g 1   No current facility-administered medications on file prior to visit.    No Known Allergies Family History  Problem Relation Age of Onset  . Heart disease Father   . Hyperlipidemia Father   . Hypertension Father   . Other Cousin    PE: BP 124/82 (BP Location: Left Arm, Patient Position: Sitting)   Pulse 67   Wt 179 lb (81.2 kg)   SpO2 98%   BMI 24.97 kg/m  Body mass index is 24.97 kg/m. Wt Readings from Last 3 Encounters:  10/11/15 179 lb (81.2 kg)  03/15/15 200 lb 11.2 oz (91 kg)  10/19/14 197 lb 14.4 oz (89.8 kg)   Constitutional: normal weight, in NAD Eyes: PERRLA, EOMI, no exophthalmos ENT: moist mucous membranes, no thyromegaly, no cervical lymphadenopathy Cardiovascular: RRR, No  MRG Respiratory: CTA B Gastrointestinal: abdomen soft, NT, ND, BS+ Musculoskeletal: no deformities, strength intact in all 4 Skin: moist, warm, no rashes Neurological: no tremor with outstretched hands, DTR normal in all 4  ASSESSMENT: 1. Pituitary tumor  2. Prolactinoma  3. Hypogonadotropic hypogonadism  4. Gynecomastia  PLAN:  1. And 2. Patient with h/o prolactinoma, diagnosed 17 years ago, stable in size, with previous recurrence after stopping dopamine agonist tx. He is now using cabergoline 0.25 mg once a week, reduced from twice a week at last visit. On this lower dose, a PRL was still very low at recheck.  - he does not have SEs from Cabergoline, but has some hot flushes after he takes it >> advised him to move it at night as he is now taking it in am - We discussed that there is no need to obtain another MRI if the prolactin levels are controlled. We reviewed his MRI reports (I do not have access the images), and they show stable size of the prolactinoma throughout the years, including on the MRI obtained in 04/2014. - he does not need further visual fields performed yearly if there is no growth in the adenoma - I will see the patient back in a year  3. Hypogonadotropic hypogonadism - We reviewed his latest testosterone level as obtained by his urologist in 06/2015 >> slightly low, however, this has been checked nonfasting and also, before his significant weight loss - we will repeat a level at 8 am, fasting  4. Gynecomastia - discussed about the fact that the only tx for chronic gynecomastia is cosmetic sx.  Orders Placed This Encounter  Procedures  . Prolactin  . Testosterone, Free, Total, SHBG   Needs refill Cabergoline when results are back  Component     Latest Ref Rng & Units 10/12/2015  Testosterone     264 - 916 ng/dL 190 (L)  Testosterone Free     6.6 - 18.1 pg/mL 2.1 (L)  Sex Horm Binding Glob, Serum     19.3 - 76.4 nmol/L 122.6 (H)  Prolactin     2.0 -  18.0 ng/mL 5.9   Prolactin level is great, so we can continue with the current dose of cabergoline. Will refill. Testosterone level is also low, however, we have to be careful with possible testosterone supplementation because of his prostate cancer. I will forward the notes to his urologist, Dr. Yves Dill, for his input.  CC: Urologist, Dr. Robbie Louis, MD PhD [facility]

## 2015-10-12 ENCOUNTER — Other Ambulatory Visit (INDEPENDENT_AMBULATORY_CARE_PROVIDER_SITE_OTHER): Payer: Medicare HMO

## 2015-10-12 DIAGNOSIS — D352 Benign neoplasm of pituitary gland: Secondary | ICD-10-CM | POA: Diagnosis not present

## 2015-10-12 DIAGNOSIS — E291 Testicular hypofunction: Secondary | ICD-10-CM

## 2015-10-12 LAB — PROLACTIN: Prolactin: 5.9 ng/mL (ref 2.0–18.0)

## 2015-10-12 NOTE — Progress Notes (Signed)
The Testosterone ordered is not the one we typically order in our office. The code may need to be changed by the office ordered.

## 2015-10-13 LAB — TESTOSTERONE, FREE, TOTAL, SHBG
SEX HORMONE BINDING: 122.6 nmol/L — AB (ref 19.3–76.4)
TESTOSTERONE FREE: 2.1 pg/mL — AB (ref 6.6–18.1)
TESTOSTERONE: 190 ng/dL — AB (ref 264–916)

## 2015-10-13 MED ORDER — CABERGOLINE 0.5 MG PO TABS: 0.2500 mg | ORAL_TABLET | ORAL | 2 refills | Status: DC

## 2016-10-10 ENCOUNTER — Encounter: Payer: Self-pay | Admitting: Internal Medicine

## 2016-10-10 ENCOUNTER — Ambulatory Visit (INDEPENDENT_AMBULATORY_CARE_PROVIDER_SITE_OTHER): Payer: Medicare Other | Admitting: Internal Medicine

## 2016-10-10 VITALS — BP 132/84 | HR 61 | Ht 71.0 in | Wt 172.0 lb

## 2016-10-10 DIAGNOSIS — D352 Benign neoplasm of pituitary gland: Secondary | ICD-10-CM

## 2016-10-10 DIAGNOSIS — E221 Hyperprolactinemia: Secondary | ICD-10-CM

## 2016-10-10 DIAGNOSIS — E291 Testicular hypofunction: Secondary | ICD-10-CM | POA: Diagnosis not present

## 2016-10-10 DIAGNOSIS — N62 Hypertrophy of breast: Secondary | ICD-10-CM | POA: Diagnosis not present

## 2016-10-10 DIAGNOSIS — E229 Hyperfunction of pituitary gland, unspecified: Secondary | ICD-10-CM

## 2016-10-10 NOTE — Progress Notes (Signed)
Patient ID: Zachary Hayden, male   DOB: Aug 23, 1950, 66 y.o.   MRN: [id number]   HPI  Zachary Hayden is a 66 y.o.-year-old male, initially referred by his PCP, Dion Body, MD, for management of a prolactinoma and h/o hypogonadotropic hypogonadism. Last visit 1 year ago.  Reviewed patient's pituitary tumor history - per his report and Dr. Boyd Kerbs note: Records reviewed from [facility] and patient records. Synopsis is below. Prior hx hyperprolactinemia since 2000. He was first diagnosed  On imaging in 2005 when his prolactin was 960, and MRI showed a 15 mmx 49mm x 20 mm lesion. He has had good suppression of his prolactin with bromocriptine, but when it was tried to be tapered off after 3 years 06/2009, his prolactin levels started to rise in normal range. Otherwise it has been well suppressed . He was on bromocriptine 2.5 twice daily, no problems with fatigue,nausea, LH, or headaches. No breast discharge. Has stable mild enlargement of the breasts bilaterally without any masses.  For his pituitary tumor, it has not shown growth but neither has it shrunk on the medication. Last MRI 2016, no change. No vision loss or headaches. Visual field test 11/2013, Dr.Charles Sydnor at [facility]  No change in shoe size. No steroid use.   Additionally, he had hypogonadism when the prolactin was high, which recovered when the prolactin was suppressed. He also developed prostate cancer treated with prostatectomy, so  He has not been supplemented for his testosterone, but it has continued to improve within the normal range, so that has not been necessary.He has a good sense of libido, but mild erectile dysfunction. In the past, he has noticed the significant decrease in libido when his prolactin has risen and testosterone has declined. He reports no new medical problems. Reports that prostate cancer is in remission. Having occasional hot flashes , no muscle weakness.   Lab and imaging tests reviewed: 11/1998- PRL  548.8, TSH 1.91, T4 8.6 2001-PRL 660 01/2003- FSH<0.3, LH <0.3, PRL 939 MRI Jan 2005, [facility]- enlarged pituitary gland slight to right of midline, homogenous enhancement post contrast, no stalk deviation, no impingement on optic chiasm, no narrowing of cavernous sinus, ICA; tumor 1.5 x1.5 x2cm, enhancing nodule within pituitary as well 04/2003- TSH 2.69, free T4 0.95, PRL 862, cortisol 13.4, Testosterone 298 ( 179-756) PTH 54, IGF-1 178 ( 90-360) October 2005 - Testosterone low at 20- was on Androgel at that time 01/2004- PRL 9 04/2004- PRL 6, Testosterone 354  MRI Dec 2006- 56mm pituitary mass, decreased T1 signal , increased T2 and hypoenhancement right aspect of sella, displacing pituitary to left 02/2005- PRL 3, Testosterone 257 02/2006-PRL 5, Testosterone 318 03/2008 PRL 3, Testosterone 278 02/2009- PRL 5, Testosterone 310>>dose reduced to bromocriptine 2.5 mg daily 05/2009 -PRL 7, Testos 271 10/2009-PRL 10 03/2010 -PRL 9>>dose increased to 5mg  daily 03/2011-PRL 3, Testos 330  MRI pituitary 01/2012, [facility]- compared to 2010 1.5 x 1.1 cm enhacing mass in the right sella involving right cavernous sinus and displacing pitiutary stalk to left (grossly unchanged appearance as compared to prior)  02/2012-PRL 6, Testosterone 329  VF 2015 >> normal.  New MRI (04/19/2014): stable prolactinoma within right side of pituitary ( 1.5 x1.2 x1.2 cm) compared to 2013 scan. There was mild left deviation of the pituitary stalk, unchanged.  2-3 years ago >> tried off Bromocriptine >> PRL increased.    We restarted Cabergoline 05/2014.   On cabergoline 0.25 mg once a week (decreasd 10/11/2014). A prolactin level obtained  a month after starting cabergoline was normal: Lab Results  Component Value Date   PROLACTIN 5.9 10/12/2015   PROLACTIN 4.3 12/27/2014   PROLACTIN 2.4 10/11/2014   PROLACTIN 3.6 07/07/2014   Reviewed testosterone levels: 07/2016: nonfasting: t testosterone 144; estradiol 46.2 at urologist  office Component     Latest Ref Rng & Units 10/12/2015  Testosterone     264 - 916 ng/dL 190 (L)  Testosterone Free     6.6 - 18.1 pg/mL 2.1 (L)  Sex Horm Binding Glob, Serum     19.3 - 76.4 nmol/L 122.6 (H)  Prolactin     2.0 - 18.0 ng/mL 5.9  At that time, I advised pt thatTestosterone level iwas low, however, we have to be careful with possible testosterone supplementation because of his prostate cancer. I advised him to check with his urologist whether testosterone tx is acceptable.  06/2015: Testosterone: 174 (before the weight loss) - nonfasting 03/2014: Testosterone: 205 (179-756), am, fasting  Energy and strength are good >> he lost more weight by improving his diet and increasing exercise. Has mild gynecomastia. No tension in breasts.   He denies: - HAs - breast discomfort - galactorrhea  He also has a history of hyperlipidemia (off statin; on Red yeast rice), GERD, prostate cancer.  ROS: Constitutional: + weight loss, no fatigue, + hot flashes, + nocturia Eyes: no blurry vision, no xerophthalmia ENT: no sore throat, no nodules palpated in throat, no dysphagia, no odynophagia, no hoarseness Cardiovascular: no CP/no SOB/no palpitations/no leg swelling Respiratory: + cough/no SOB/no wheezing Gastrointestinal: no N/no V/no D/no C/no acid reflux Musculoskeletal: no muscle aches/no joint aches Skin: no rashes, no hair loss Neurological: no tremors/no numbness/no tingling/no dizziness + diff wit erections; + enlarged breasts - long standing  I reviewed pt's medications, allergies, PMH, social hx, family hx, and changes were documented in the history of present illness. Otherwise, unchanged from my initial visit note.   Past Medical History:  Diagnosis Date  . Allergy   . Cancer [facility]) 2005   prostate  . Colon polyp 2012   3 polyps  . Hyperlipidemia   . Internal hemorrhoids   . Pituitary adenoma ([place]) 1992  . Prostate cancer ([place]) 2005   Past Surgical History:   Procedure Laterality Date  . COLONOSCOPY  2010, 2012, 2014   Dr. Dionne Milo  . PROSTATE SURGERY  2005   radical  . PROSTATECTOMY     Social History   Social History  . Marital Status: Married    Spouse Name: N/A  . Number of Children: 4   Occupational History  .  [employer]   Social History Main Topics  . Smoking status: Never Smoker   . Smokeless tobacco: Never Used  . Alcohol Use: 0.0 oz/week    0 Standard drinks or equivalent per week     Comment: glass of wine monthly   . Drug Use: No   Current Outpatient Prescriptions on File Prior to Visit  Medication Sig Dispense Refill  . aspirin EC 81 MG tablet Take 81 mg by mouth daily.    . cabergoline (DOSTINEX) 0.5 MG tablet Take 0.5 tablets (0.25 mg total) by mouth once a week. 10 tablet 2  . calcium carbonate (OS-CAL) 600 MG TABS tablet Take 600 mg by mouth.    . diphenhydrAMINE (BENADRYL) 25 MG tablet Take 25 mg by mouth daily.    Marland Kitchen docusate sodium (COLACE) 100 MG capsule Take 100 mg by mouth 2 (two) times daily.    Marland Kitchen  flunisolide (NASALIDE) 25 MCG/ACT (0.025%) SOLN Place 1 spray into the nose 2 (two) times daily. 25 mL 5  . hydrocortisone-pramoxine (ANALPRAM HC) 2.5-1 % rectal cream Place 1 application rectally 3 (three) times daily. 30 g 1  . Omega-3 Fatty Acids (FISH OIL) 500 MG CAPS Take 1 capsule by mouth daily.    . ranitidine (ZANTAC) 75 MG tablet Take 150 mg by mouth daily.     . sildenafil (VIAGRA) 100 MG tablet Take 100 mg by mouth as needed for erectile dysfunction.    Marland Kitchen therapeutic multivitamin-minerals (THERAGRAN-M) tablet Take 1 tablet by mouth daily.    . vitamin C (ASCORBIC ACID) 500 MG tablet Take 500 mg by mouth daily.    Marland Kitchen atorvastatin (LIPITOR) 20 MG tablet Take 1 tablet (20 mg total) by mouth daily. (Patient not taking: Reported on 10/11/2015) 90 tablet 1  . fenofibrate 160 MG tablet Take 1 tablet (160 mg total) by mouth daily. (Patient not taking: Reported on 10/11/2015) 90 tablet 1   No current  facility-administered medications on file prior to visit.    No Known Allergies Family History  Problem Relation Age of Onset  . Heart disease Father   . Hyperlipidemia Father   . Hypertension Father   . Other Cousin    PE: BP 132/84   Pulse 61   Ht 5\' 11"  (1.803 m)   Wt 172 lb (78 kg)   SpO2 98%   BMI 23.99 kg/m  Body mass index is 23.99 kg/m. Wt Readings from Last 3 Encounters:  10/10/16 172 lb (78 kg)  10/11/15 179 lb (81.2 kg)  03/15/15 200 lb 11.2 oz (91 kg)   Constitutional: normal weight, in NAD Eyes: PERRLA, EOMI, no exophthalmos ENT: moist mucous membranes, no thyromegaly, no cervical lymphadenopathy Cardiovascular: RRR, No MRG Respiratory: CTA B Gastrointestinal: abdomen soft, NT, ND, BS+ Musculoskeletal: no deformities, strength intact in all 4 Skin: moist, warm, no rashes Neurological: no tremor with outstretched hands, DTR normal in all 4  ASSESSMENT: 1. Pituitary tumor  2. Prolactinoma  3. Hypogonadotropic hypogonadism  4. Gynecomastia  PLAN:  1. And 2. Patient with h/o prolactinoma, diagnosed 18 years ago, Stable in size, with previous recurrence after stopping the dopamine agonist treatment, but now on a stable dose of cabergoline, 0.25 mg weekly, reduced from twice a week 2 years ago. Prolactin levels stayed normal, and the levels were reviewed with the patient today.  - He still has some hot flashes from cabergoline - we do not need to obtain another MRI unless the prolactin levels start increasing. I reviewed his MRI reports (I do not have access to the images) and they show stable size of the prolactinoma throughout the years, including the latest MRI obtained in 04/2014. - He does not need further visual fields - I will see the patient back in 1 year  3. Hypogonadotropic hypogonadism - Patient had recent testosterone level obtained at urologist office, however, he was not fasting at that time. We discussed the absolute need to be fasting  whenever he has his testosterone checked. He is fasting today, so we can check a testosterone level and will also add an estradiol level since this was slightly high at the last visit with the urologist in 07/2016. -  we discussed about the possible use of Clomid, especially in the setting of an elevated estrogen, and especially with his history of prostate cancer. However, we would need the urologist input even to start Clomid. We discussed that this is  not usually a long-term treatment, though.  4. Gynecomastia - Will check an estrogen level - Clomid may help with breast discomfort, but he does not have significant discomfort, only increased size of his breasts.   Needs refills Caberg.  Component     Latest Ref Rng & Units 10/10/2016  Testosterone     264 - 916 ng/dL 158 (L)  Testosterone Free     6.6 - 18.1 pg/mL 2.3 (L)  Sex Horm Binding Glob, Serum     19.3 - 76.4 nmol/L 91.9 (H)  Estradiol, Free     ADULTS: < OR = 0.45 pg/mL 0.18  Estradiol     ADULTS: < OR = 29 pg/mL 11  Prolactin     2.0 - 18.0 ng/mL 4.4   Prolactin level is normal. We'll refill cabergoline. Estradiol level is normal.  Testosterone is quite low. We can try Clomid, but I am not sure if this would be enough. We'll ask him to get his urologist input before doing this.  CC: Urologist, Dr. Robbie Louis, MD PhD [facility]

## 2016-10-10 NOTE — Patient Instructions (Addendum)
Please continue Cabergoline 0.25 mg weekly.  Please stop at the lab.  Please return in 1 year.

## 2016-10-11 LAB — PROLACTIN: Prolactin: 4.4 ng/mL (ref 2.0–18.0)

## 2016-10-12 LAB — TESTOSTERONE, FREE, TOTAL, SHBG
SEX HORMONE BINDING: 91.9 nmol/L — AB (ref 19.3–76.4)
TESTOSTERONE FREE: 2.3 pg/mL — AB (ref 6.6–18.1)
Testosterone: 158 ng/dL — ABNORMAL LOW (ref 264–916)

## 2016-10-16 LAB — ESTRADIOL, FREE
Estradiol, Free: 0.18 pg/mL (ref <=0.45)
Estradiol: 11 pg/mL (ref <=29)

## 2016-10-17 MED ORDER — CABERGOLINE 0.5 MG PO TABS: 0.2500 mg | ORAL_TABLET | ORAL | 2 refills | Status: DC

## 2016-10-18 ENCOUNTER — Encounter: Payer: Self-pay | Admitting: Internal Medicine

## 2016-10-21 ENCOUNTER — Other Ambulatory Visit: Payer: Self-pay | Admitting: Internal Medicine

## 2016-10-21 MED ORDER — CLOMIPHENE CITRATE 50 MG PO TABS: 25.0000 mg | ORAL_TABLET | Freq: Every day | ORAL | 1 refills | Status: DC

## 2016-10-23 ENCOUNTER — Telehealth: Payer: Self-pay

## 2016-10-23 NOTE — Telephone Encounter (Signed)
Thank you :)

## 2016-10-23 NOTE — Telephone Encounter (Signed)
Called patient as I had received notification that the Clomid was rejected by insurance; he had already picked up the medication, but I will complete a PA to hopefully have it approved the next time. Patient understood, and also made his appointment for 4 months, as stated in the last patient e-mail.

## 2017-01-21 ENCOUNTER — Encounter: Payer: Self-pay | Admitting: Internal Medicine

## 2017-01-22 ENCOUNTER — Other Ambulatory Visit: Payer: Self-pay | Admitting: Internal Medicine

## 2017-02-18 DIAGNOSIS — E7800 Pure hypercholesterolemia, unspecified: Secondary | ICD-10-CM | POA: Diagnosis not present

## 2017-02-19 ENCOUNTER — Encounter: Payer: Self-pay | Admitting: Internal Medicine

## 2017-02-19 ENCOUNTER — Ambulatory Visit (INDEPENDENT_AMBULATORY_CARE_PROVIDER_SITE_OTHER): Payer: PPO | Admitting: Internal Medicine

## 2017-02-19 VITALS — BP 132/78 | HR 69 | Ht 71.0 in | Wt 172.6 lb

## 2017-02-19 DIAGNOSIS — N62 Hypertrophy of breast: Secondary | ICD-10-CM

## 2017-02-19 DIAGNOSIS — E291 Testicular hypofunction: Secondary | ICD-10-CM | POA: Diagnosis not present

## 2017-02-19 DIAGNOSIS — E229 Hyperfunction of pituitary gland, unspecified: Secondary | ICD-10-CM

## 2017-02-19 DIAGNOSIS — E230 Hypopituitarism: Secondary | ICD-10-CM | POA: Diagnosis not present

## 2017-02-19 DIAGNOSIS — E221 Hyperprolactinemia: Secondary | ICD-10-CM

## 2017-02-19 DIAGNOSIS — D352 Benign neoplasm of pituitary gland: Secondary | ICD-10-CM | POA: Diagnosis not present

## 2017-02-19 LAB — CBC
HCT: 41.2 % (ref 39.0–52.0)
Hemoglobin: 13.9 g/dL (ref 13.0–17.0)
MCHC: 33.8 g/dL (ref 30.0–36.0)
MCV: 88.3 fl (ref 78.0–100.0)
PLATELETS: 214 10*3/uL (ref 150.0–400.0)
RBC: 4.67 Mil/uL (ref 4.22–5.81)
RDW: 13.4 % (ref 11.5–15.5)
WBC: 6.8 10*3/uL (ref 4.0–10.5)

## 2017-02-19 NOTE — Patient Instructions (Signed)
Please stop at the lab.  For now, continue Clomid 25 mg daily.  Continue Cabergoline 0.25 mg weekly.  Please return in 6 months.

## 2017-02-19 NOTE — Progress Notes (Signed)
Patient ID: Linward Natal III, male   DOB: [birth year], 67 y.o.   MRN: [id number]   HPI  DEGAN HANSER III is a 67 y.o.-year-old male, initially referred by his PCP, Dion Body, MD, for management of a prolactinoma and hypogonadotropic hypogonadism. Last visit 5 months ago  Reviewed patient's pituitary tumor history:  Pt was dx'ed with hyperprolactinemia since 2000. He was first diagnosed  On imaging in 2005 when his prolactin was 960, and MRI showed a 15 mmx 58mm x 20 mm lesion. He has had good suppression of his prolactin with bromocriptine, but when it was tried to be tapered off after 3 years 06/2009, his prolactin levels started to rise in normal range. Otherwise it has been well suppressed . He was on bromocriptine 2.5 twice daily, no problems with fatigue,nausea, LH, or headaches. No breast discharge. Has stable mild enlargement of the breasts bilaterally without any masses.  Additionally, he had hypogonadism when the prolactin was high, which initially recovered when the prolactin was suppressed, but recurred recently despite PRL being normal. He also developed prostate cancer treated with prostatectomy. He has low libido, mild erectile dysfunction. In the past, he has noticed the significant decrease in libido when his prolactin has risen and testosterone has declined. Having occasional hot flashes , no muscle weakness.   Lab and imaging tests reviewed: 11/1998 - PRL 548.8, TSH 1.91, T4 8.6 2001- PRL 660 01/2003 - FSH<0.3, LH <0.3, PRL 939 02/2003 - MRI [facility]- enlarged pituitary gland slight to right of midline, homogenous enhancement post contrast, no stalk deviation, no impingement on optic chiasm, no narrowing of cavernous sinus, ICA; tumor 1.5 x1.5 x2cm, enhancing nodule within pituitary as well 04/2003- TSH 2.69, free T4 0.95, PRL 862, cortisol 13.4, Testosterone 298 ( 179-756), IGF-1 178 ( 90-360) 11/2003 - Testosterone low at 20- was on Androgel at that time 01/2004 - PRL 9 04/2004  - PRL 6, Testosterone 354 01/2005 - MRI: 71mm pituitary mass, decreased T1 signal , increased T2 and hypoenhancement right aspect of sella, displacing pituitary to left 02/2005- PRL 3, Testosterone 257 02/2006-PRL 5, Testosterone 318 03/2008 PRL 3, Testosterone 278 02/2009- PRL 5, Testosterone 310>>dose reduced to bromocriptine 2.5 mg daily 05/2009 -PRL 7, Testos 271 10/2009-PRL 10 03/2010 -PRL 9>>dose increased to 5mg  daily 03/2011-PRL 3, Testos 330 01/2012 - MRI pituitary , [facility]- compared to 2010: 1.5 x 1.1 cm enhacing mass in the right sella involving right cavernous sinus and displacing pitiutary stalk to left (grossly unchanged appearance as compared to prior) 02/2012-PRL 6, Testosterone 329 2015 - VF normal. 04/19/2014 MRI: stable prolactinoma within right side of pituitary ( 1.5 x1.2 x1.2 cm) compared to 2013 scan. There was mild left deviation of the pituitary stalk, unchanged.  2-3 years ago >> tried off Bromocriptine >> PRL increased.    He restarted Cabergoline 05/2014.   On cabergoline 0.25 mg once a week a week (decreasd 10/11/2014).  Latest prolactin levels have all been normal: Lab Results  Component Value Date   PROLACTIN 4.4 10/10/2016   PROLACTIN 5.9 10/12/2015   PROLACTIN 4.3 12/27/2014   PROLACTIN 2.4 10/11/2014   PROLACTIN 3.6 07/07/2014   Reviewed testosterone levels: 07/2016: nonfasting: t testosterone 144; estradiol 46.2 at urologist office 10/12/2015: Total testosterone 190 (264-916), SHBG 122.6 (19.3-76.4), free testosterone 2.1 (6.6-18.1)  06/2015: nonfasting: t Testosterone: 174 (before the weight loss) 03/2014: Testosterone: 205 (179-756), am, fasting  Last PSA 0.0 in 07/2016.  At last visit, we started Clomid (with Urology consent): 25 mg daily.+  more hot flushes.  He is not feeling any increase of libido or improvement in erections on Clomid. He has mild gynecomastia without discomfort.  No galactorrhea. He also denies headaches, congestion, nausea or  vomiting with cabergoline.  He also has a history of hyperlipidemia (off statin; on Red yeast rice), GERD.  ROS: Constitutional: no weight gain/no weight loss, no fatigue, + hot flashes, + nocturia Eyes: no blurry vision, no xerophthalmia ENT: no sore throat, no nodules palpated in throat, no dysphagia, no odynophagia, no hoarseness Cardiovascular: no CP/no SOB/no palpitations/no leg swelling Respiratory: no cough/no SOB/no wheezing Gastrointestinal: no N/no V/no D/C/+acid reflux Musculoskeletal: no muscle aches/no joint aches Skin: no rashes, no hair loss Neurological: no tremors/no numbness/no tingling/no dizziness + Problems with erections  I reviewed pt's medications, allergies, PMH, social hx, family hx, and changes were documented in the history of present illness. Otherwise, unchanged from my initial visit note.   Past Medical History:  Diagnosis Date  . Allergy   . Cancer [facility]) 2005   prostate  . Colon polyp 2012   3 polyps  . Hyperlipidemia   . Internal hemorrhoids   . Pituitary adenoma ([place]) 1992  . Prostate cancer ([place]) 2005   Past Surgical History:  Procedure Laterality Date  . COLONOSCOPY  2010, 2012, 2014   Dr. Dionne Milo  . PROSTATE SURGERY  2005   radical  . PROSTATECTOMY     Social History   Social History  . Marital Status: Married    Spouse Name: N/A  . Number of Children: 4   Occupational History  .  [employer]   Social History Main Topics  . Smoking status: Never Smoker   . Smokeless tobacco: Never Used  . Alcohol Use: 0.0 oz/week    0 Standard drinks or equivalent per week     Comment: glass of wine monthly   . Drug Use: No   Current Outpatient Medications on File Prior to Visit  Medication Sig Dispense Refill  . aspirin EC 81 MG tablet Take 81 mg by mouth daily.    Marland Kitchen atorvastatin (LIPITOR) 20 MG tablet Take 1 tablet (20 mg total) by mouth daily. (Patient not taking: Reported on 10/11/2015) 90 tablet 1  . cabergoline (DOSTINEX) 0.5 MG tablet  Take 0.5 tablets (0.25 mg total) by mouth once a week. 10 tablet 2  . calcium carbonate (OS-CAL) 600 MG TABS tablet Take 600 mg by mouth.    . clomiPHENE (CLOMID) 50 MG tablet TAKE ONE-HALF TABLET BY MOUTH EVERY DAY 45 tablet 1  . diphenhydrAMINE (BENADRYL) 25 MG tablet Take 25 mg by mouth daily.    Marland Kitchen docusate sodium (COLACE) 100 MG capsule Take 100 mg by mouth 2 (two) times daily.    . fenofibrate 160 MG tablet Take 1 tablet (160 mg total) by mouth daily. (Patient not taking: Reported on 10/11/2015) 90 tablet 1  . flunisolide (NASALIDE) 25 MCG/ACT (0.025%) SOLN Place 1 spray into the nose 2 (two) times daily. 25 mL 5  . hydrocortisone-pramoxine (ANALPRAM HC) 2.5-1 % rectal cream Place 1 application rectally 3 (three) times daily. 30 g 1  . Omega-3 Fatty Acids (FISH OIL) 500 MG CAPS Take 1 capsule by mouth daily.    . ranitidine (ZANTAC) 75 MG tablet Take 150 mg by mouth daily.     . Red Yeast Rice Extract (RED YEAST RICE PO) Take by mouth.    . sildenafil (VIAGRA) 100 MG tablet Take 100 mg by mouth as needed for erectile dysfunction.    Marland Kitchen  therapeutic multivitamin-minerals (THERAGRAN-M) tablet Take 1 tablet by mouth daily.    . vitamin C (ASCORBIC ACID) 500 MG tablet Take 500 mg by mouth daily.     No current facility-administered medications on file prior to visit.    No Known Allergies Family History  Problem Relation Age of Onset  . Heart disease Father   . Hyperlipidemia Father   . Hypertension Father   . Other Cousin    PE: BP 132/78   Pulse 69   Ht 5\' 11"  (1.803 m)   Wt 172 lb 9.6 oz (78.3 kg)   SpO2 97%   BMI 24.07 kg/m  Body mass index is 24.07 kg/m. Wt Readings from Last 3 Encounters:  02/19/17 172 lb 9.6 oz (78.3 kg)  10/10/16 172 lb (78 kg)  10/11/15 179 lb (81.2 kg)   Constitutional: normal weight, in NAD Eyes: PERRLA, EOMI, no exophthalmos ENT: moist mucous membranes, no thyromegaly, no cervical lymphadenopathy Cardiovascular: RRR, No MRG Respiratory: CTA  B Gastrointestinal: abdomen soft, NT, ND, BS+ Musculoskeletal: no deformities, strength intact in all 4 Skin: moist, warm, no rashes Neurological: no tremor with outstretched hands, DTR normal in all 4  ASSESSMENT: 1. Pituitary tumor  2. Prolactinoma  3. Hypogonadotropic hypogonadism  4. Gynecomastia  PLAN:  1. And 2.  Patient with history of prolactinoma, diagnosed in 2000.  This has decreased in size after starting treatment with bromocriptine and then remained stable, at 1.5 cm based on last pituitary MRI from 04/19/2014.  He is currently on a stable dose of cabergoline (started 05/2014), 0.25 mg weekly (reduced from twice a day in 09/2014).  His prolactin levels are normal (reviewed them today along with the patient) and he has no side effects from the medication, other than some hot flashes, which may be from his hypogonadism.  We discussed that sometimes drug holidays are employed during treatment of prolactinomas, however, he had one in the past but prolactin started to increase so he had to restart cabergoline.  I am not planning to stop this again for him. - We again discussed that we do not need to obtain another MRI unless the prolactin level start increasing. - he does not need further visual fields.  Latest was normal in 2015. - I will see the patient back in 6 months  3. Hypogonadotropic hypogonadism - Patient has had low testosterone levels due to his high prolactin levels in the past and they remain low despite efficacy of the prolactinoma treatment.  He did complain of low libido and problems with erections and he was interested in starting treatment for his low testosterone level.  We discussed at last visit that we have to be very careful with this due to previous history of prostate cancer.  Of note, his urologist gave [place] the ok to use testosterone supplement. Since he also had a high estrogen level at the time at his visit with a urologist in 07/2016, I suggested to try  Clomid first.  We started this in 09/2016.  He noticed more hot flashes and not perceivable improvement in libido or erections. - Reviewed previous testosterone levels: testosterone level was low (total testosterone 144), but he was not fasting at the time of his last visit with his urologist (he usually h sees him in June of every year); we repeated his labs on 10/10/2016, fasting, and the testosterone was still low (total testosterone 158, free testosterone 2.3). Most recent estrogen level has been normal (10/10/2016: Free estradiol level 11, normal <29) -  We will recheck his testosterone level now, on Clomid  - since he is having more hot flashes and no improvement in his hypogonadal symptoms, we discussed that we may need to decrease the dose if his testosterone levels are now normal (25 mg every other day) or, if the testosterone level is low, changed to testosterone gel.  We discussed that this is giving him a more physiologic testosterone level profile and it is preferred with history of prostate cancer - He usually has PSA levels with his urologist.  We will not check one today.  Last was from 07/2016: 0.0 reportedly - Today we will check a testosterone level and a CBC  4. Gynecomastia - Most recent estrogen level normal, even before starting Clomid - He does not have breast discomfort, only breast enlargement  Needs refills of Clomd if we will continue with this.  CC: Urologist, Dr. Yves Dill  Component     Latest Ref Rng & Units 02/19/2017  RBC     4.22 - 5.81 Mil/uL 4.67  Hemoglobin     13.0 - 17.0 g/dL 13.9  HCT     39.0 - 52.0 % 41.2  Testosterone     264 - 916 ng/dL 92 (L)  Testosterone Free     6.6 - 18.1 pg/mL 2.0 (L)  Sex Horm Binding Glob, Serum     19.3 - 76.4 nmol/L 118.1 (H)  Prolactin     2.0 - 18.0 ng/mL 5.1   Normal PRL. Normal Hb/HT. Testosterone is low >> no point in continuing Clomid >> try to start Testosterone gel 2.5 g daily x 2 weeks, then increase to 5 g  daily. Will have him RTC for a testosterone level in 3 months, fasting, in am.   Philemon Kingdom, MD PhD [facility]

## 2017-02-20 LAB — PROLACTIN: Prolactin: 5.1 ng/mL (ref 2.0–18.0)

## 2017-02-21 ENCOUNTER — Encounter: Payer: Self-pay | Admitting: Internal Medicine

## 2017-02-21 ENCOUNTER — Other Ambulatory Visit: Payer: Self-pay | Admitting: Internal Medicine

## 2017-02-21 LAB — TESTOSTERONE, FREE, TOTAL, SHBG
SEX HORMONE BINDING: 118.1 nmol/L — AB (ref 19.3–76.4)
Testosterone, Free: 2 pg/mL — ABNORMAL LOW (ref 6.6–18.1)
Testosterone: 92 ng/dL — ABNORMAL LOW (ref 264–916)

## 2017-02-21 MED ORDER — TESTOSTERONE 50 MG/5GM (1%) TD GEL: 5.0000 g | Freq: Every day | TRANSDERMAL | 5 refills | Status: DC

## 2017-02-25 ENCOUNTER — Encounter: Payer: Self-pay | Admitting: Internal Medicine

## 2017-02-25 ENCOUNTER — Other Ambulatory Visit: Payer: Self-pay | Admitting: Internal Medicine

## 2017-02-25 DIAGNOSIS — E7800 Pure hypercholesterolemia, unspecified: Secondary | ICD-10-CM | POA: Diagnosis not present

## 2017-02-25 DIAGNOSIS — R030 Elevated blood-pressure reading, without diagnosis of hypertension: Secondary | ICD-10-CM | POA: Diagnosis not present

## 2017-02-25 MED ORDER — TESTOSTERONE 20.25 MG/1.25GM (1.62%) TD GEL: TRANSDERMAL | 5 refills | Status: DC

## 2017-02-26 ENCOUNTER — Telehealth: Payer: Self-pay

## 2017-02-26 NOTE — Telephone Encounter (Signed)
I have not received anything yet for the PA, I have faxed the RX to the pharmacy and will await a letter for them on if a PA is needed  C,  I signed the Rx for the testosterone 1.62% - I think you may have faxed it already. He says we need a PA >> did you receive anything? Or should the PA be started separately?  Ty,  C

## 2017-02-26 NOTE — Telephone Encounter (Signed)
It was sent via fax, will check tomorrow if it has went through the confirmation fax has not came back yet

## 2017-02-26 NOTE — Telephone Encounter (Signed)
Pharmacy is calling back about pts script that was sent over.   Please advise.     [phone]

## 2017-02-27 NOTE — Telephone Encounter (Signed)
Pharmacy has RX

## 2017-03-10 ENCOUNTER — Encounter: Payer: Self-pay | Admitting: Internal Medicine

## 2017-03-11 ENCOUNTER — Telehealth: Payer: Self-pay

## 2017-03-11 NOTE — Telephone Encounter (Signed)
Tried to check on the status of PA being as it has been over 72 hours and they stated pt is inactive and would not proceed with the call. Pt has been notified

## 2017-03-14 ENCOUNTER — Telehealth: Payer: Self-pay | Admitting: Internal Medicine

## 2017-03-14 NOTE — Telephone Encounter (Addendum)
[place] calling from [place] [facility] stated patient need  Authorization, for brand name androgel option 1 Ref # [id number]  it is a urgent request and must have a call back before the week end.   plus part D [phone]

## 2017-03-14 NOTE — Telephone Encounter (Signed)
Zachary Hayden stated claim was denied, Dr. Cruzita Lederer is aware and we are awaiting fax for the appeal

## 2017-03-14 NOTE — Telephone Encounter (Signed)
Pt is aware that insurance faxed another from over that Dr. Cruzita Lederer completed and was faxed on 03/13/17 and we should hear back within 24-72 hours

## 2017-03-14 NOTE — Telephone Encounter (Signed)
Spoke with Solmon Ice at [employer], she is unsure what the call is in regards to and is sending a message to her supervisor to figure it out.

## 2017-03-17 ENCOUNTER — Other Ambulatory Visit: Payer: Self-pay

## 2017-03-17 MED ORDER — TESTOSTERONE 20.25 MG/ACT (1.62%) TD GEL: TRANSDERMAL | 1 refills | Status: DC

## 2017-03-20 ENCOUNTER — Telehealth: Payer: Self-pay

## 2017-03-20 NOTE — Telephone Encounter (Signed)
Spoke with [facility] and they stated a PA is not needed and that the pt just can not fill testosterone medication until 03/29/17.

## 2017-03-21 ENCOUNTER — Telehealth: Payer: Self-pay | Admitting: Internal Medicine

## 2017-03-21 ENCOUNTER — Telehealth: Payer: Self-pay

## 2017-03-21 NOTE — Telephone Encounter (Signed)
Generic has already been sent in and all of this has been handled in previous [facility] pharmacy is aware

## 2017-03-21 NOTE — Telephone Encounter (Signed)
[facility] is calling back stating that they needed the Generic sent in for the Androgel.  That some will require a PA    Any questions please call Lilia Pro at [place] [phone]

## 2017-03-21 NOTE — Telephone Encounter (Signed)
[facility] called and wanted some information about PA for androgel- I read message from the previous note and it stated he just can not pick it up until 03/29/17- she stated an understanding and will call back if she has any further questions- just an [place]

## 2017-03-21 NOTE — Telephone Encounter (Signed)
Spoke with Shirlean Mylar and he stated he is calling [employer] to get to the bottom of this. He is not sure why we keep getting these calls or why it is saying denied when the insurance is who told us generic is covered.

## 2017-03-21 NOTE — Telephone Encounter (Signed)
[facility] pharmacy stated they are not aware, and haven't heard form our office.

## 2017-03-25 ENCOUNTER — Encounter: Payer: Self-pay | Admitting: Internal Medicine

## 2017-03-26 ENCOUNTER — Telehealth: Payer: Self-pay

## 2017-03-26 NOTE — Telephone Encounter (Signed)
Pts insurance company called today and stated that the patient was irrate and upset that our office is not handling his PA. I explained to the woman who called that we have spoken to the patient and the pharmacy multiple times and were told that he needed no more PA's that the generic was covered and could be filled on 03/29/17. The woman from the insurance company said that this is not correct and that she needs pts information for why he needs this medication sent over for them to review. I explained that we will need something sent to [place] requesting this per HIPAA guidelines we can not just send pt information without some type of written request that is our office policy. She stated that she is in the Pharmacy department and she doesn't fax anything so the pharmacy would have to send that request. The pharmacy does not sent documentation requests all that they would send is a message telling us to contact the [place] is who faxed [place] previously in regards to infromation we sent when doing the PA for androgel before the switch. The woman stated that she would call the pharmacy then and call me back. She asked for a direct line and I informed her that we do not give out direct lines because we have no VM and we are not at our desks always. She complained about her wait time and I told her that unfortunatly she will have to call again and get transferred to my desk. She was not happy about that and hung up.

## 2017-03-27 NOTE — Telephone Encounter (Signed)
Finally! Thank you, C, for working on this!

## 2017-03-27 NOTE — Telephone Encounter (Signed)
Received form and per CoverMyMeds PA Case: [id number], Status: Approved, Coverage Starts on: 03/26/2017 12:00:00 AM, Coverage Ends on: 02/10/2018 12:00:00 AM. Pharmacy is aware

## 2017-04-08 ENCOUNTER — Encounter: Payer: Self-pay | Admitting: *Deleted

## 2017-04-09 ENCOUNTER — Ambulatory Visit: Payer: PPO | Admitting: Anesthesiology

## 2017-04-09 ENCOUNTER — Ambulatory Visit
Admission: RE | Admit: 2017-04-09 | Discharge: 2017-04-09 | Disposition: A | Discharge status: Home / Self Care | Payer: PPO | Source: Ambulatory Visit | Attending: Internal Medicine | Admitting: Internal Medicine

## 2017-04-09 ENCOUNTER — Encounter: Payer: Self-pay | Admitting: *Deleted

## 2017-04-09 ENCOUNTER — Encounter
Admission: RE | Disposition: A | Discharge status: Home / Self Care | Payer: Self-pay | Source: Ambulatory Visit | Attending: Internal Medicine

## 2017-04-09 DIAGNOSIS — K2270 Barrett's esophagus without dysplasia: Secondary | ICD-10-CM | POA: Diagnosis not present

## 2017-04-09 DIAGNOSIS — K219 Gastro-esophageal reflux disease without esophagitis: Secondary | ICD-10-CM | POA: Insufficient documentation

## 2017-04-09 DIAGNOSIS — Z1211 Encounter for screening for malignant neoplasm of colon: Secondary | ICD-10-CM | POA: Insufficient documentation

## 2017-04-09 DIAGNOSIS — Z79899 Other long term (current) drug therapy: Secondary | ICD-10-CM | POA: Diagnosis not present

## 2017-04-09 DIAGNOSIS — K640 First degree hemorrhoids: Secondary | ICD-10-CM | POA: Insufficient documentation

## 2017-04-09 DIAGNOSIS — Z7982 Long term (current) use of aspirin: Secondary | ICD-10-CM | POA: Insufficient documentation

## 2017-04-09 DIAGNOSIS — Z8546 Personal history of malignant neoplasm of prostate: Secondary | ICD-10-CM | POA: Insufficient documentation

## 2017-04-09 DIAGNOSIS — Z86010 Personal history of colonic polyps: Secondary | ICD-10-CM | POA: Diagnosis not present

## 2017-04-09 DIAGNOSIS — K648 Other hemorrhoids: Secondary | ICD-10-CM | POA: Diagnosis not present

## 2017-04-09 DIAGNOSIS — D120 Benign neoplasm of cecum: Secondary | ICD-10-CM | POA: Diagnosis not present

## 2017-04-09 DIAGNOSIS — K228 Other specified diseases of esophagus: Secondary | ICD-10-CM | POA: Diagnosis not present

## 2017-04-09 DIAGNOSIS — K635 Polyp of colon: Secondary | ICD-10-CM | POA: Diagnosis not present

## 2017-04-09 DIAGNOSIS — K649 Unspecified hemorrhoids: Secondary | ICD-10-CM | POA: Diagnosis not present

## 2017-04-09 DIAGNOSIS — K6389 Other specified diseases of intestine: Secondary | ICD-10-CM | POA: Diagnosis not present

## 2017-04-09 HISTORY — DX: Gastro-esophageal reflux disease without esophagitis: K21.9

## 2017-04-09 HISTORY — PX: COLONOSCOPY WITH PROPOFOL: SHX5780

## 2017-04-09 HISTORY — PX: ESOPHAGOGASTRODUODENOSCOPY (EGD) WITH PROPOFOL: SHX5813

## 2017-04-09 SURGERY — ESOPHAGOGASTRODUODENOSCOPY (EGD) WITH PROPOFOL
Anesthesia: General

## 2017-04-09 MED ORDER — FENTANYL CITRATE (PF) 100 MCG/2ML IJ SOLN
INTRAMUSCULAR | Status: DC | PRN
  Administered 2017-04-09: 50 ug via INTRAVENOUS

## 2017-04-09 MED ORDER — SODIUM CHLORIDE 0.9 % IV SOLN
INTRAVENOUS | Status: DC
  Administered 2017-04-09: 13:00:00 via INTRAVENOUS

## 2017-04-09 MED ORDER — PROPOFOL 10 MG/ML IV BOLUS
INTRAVENOUS | Status: DC | PRN
  Administered 2017-04-09: 100 mg via INTRAVENOUS
  Administered 2017-04-09: 50 mg via INTRAVENOUS

## 2017-04-09 MED ORDER — PROPOFOL 500 MG/50ML IV EMUL
INTRAVENOUS | Status: DC | PRN
  Administered 2017-04-09: 100 ug/kg/min via INTRAVENOUS

## 2017-04-09 MED ORDER — PROPOFOL 10 MG/ML IV BOLUS
INTRAVENOUS | Status: AC
  Filled 2017-04-09: qty 40

## 2017-04-09 MED ORDER — LIDOCAINE HCL (CARDIAC) 20 MG/ML IV SOLN
INTRAVENOUS | Status: DC | PRN
  Administered 2017-04-09: 80 mg via INTRAVENOUS

## 2017-04-09 MED ORDER — FENTANYL CITRATE (PF) 100 MCG/2ML IJ SOLN
INTRAMUSCULAR | Status: AC
  Filled 2017-04-09: qty 2

## 2017-04-09 NOTE — Op Note (Signed)
[facility] Patient Name: Zachary Hayden Procedure Date: 04/09/2017 12:53 PM MRN: [id number] Account #: [id number] Date of Birth: May 11, 1950 Admit Type: Outpatient Age: 67 Room: [facility] ENDO ROOM 4 Gender: Male Note Status: Finalized Procedure:            Upper GI endoscopy Indications:          Esophageal reflux Providers:            Benay Pike. Alice Reichert MD, MD Referring MD:         Dion Body (Referring MD) Medicines:            Propofol per Anesthesia Complications:        No immediate complications. Procedure:            Pre-Anesthesia Assessment:                       - The risks and benefits of the procedure and the                        sedation options and risks were discussed with the                        patient. All questions were answered and informed                        consent was obtained.                       - Patient identification and proposed procedure were                        verified prior to the procedure by the nurse. The                        procedure was verified in the procedure room.                       - ASA Grade Assessment: II - A patient with mild                        systemic disease.                       - After reviewing the risks and benefits, the patient                        was deemed in satisfactory condition to undergo the                        procedure.                       After obtaining informed consent, the endoscope was                        passed under direct vision. Throughout the procedure,                        the patient's blood pressure, pulse, and oxygen  saturations were monitored continuously. The Endoscope                        was introduced through the mouth, and advanced to the                        third part of duodenum. The upper GI endoscopy was                        accomplished without difficulty. The patient tolerated          the procedure well. Findings:      The Z-line was irregular and was found at the gastroesophageal junction.       Mucosa was biopsied with a cold forceps for histology. One specimen       bottle was sent to pathology.      The entire examined stomach was normal.      The cardia and gastric fundus were normal on retroflexion.      The examined duodenum was normal. Impression:           - Z-line irregular, at the gastroesophageal junction.                        Biopsied.                       - Normal stomach.                       - Normal examined duodenum. Recommendation:       - Await pathology results.                       - Proceed with colonoscopy Procedure Code(s):    --- Professional ---                       [phone], Esophagogastroduodenoscopy, flexible, transoral;                        with biopsy, single or multiple Diagnosis Code(s):    --- Professional ---                       K22.8, Other specified diseases of esophagus                       K21.9, Gastro-esophageal reflux disease without                        esophagitis CPT copyright 2016 American Medical Association. All rights reserved. The codes documented in this report are preliminary and upon coder review may  be revised to meet current compliance requirements. Efrain Sella MD, MD 04/09/2017 1:08:24 PM This report has been signed electronically. Number of Addenda: 0 Note Initiated On: 04/09/2017 12:53 PM      [facility]

## 2017-04-09 NOTE — Anesthesia Preprocedure Evaluation (Signed)
Anesthesia Evaluation  Patient identified by MRN, date of birth, ID band Patient awake    Reviewed: Allergy & Precautions, NPO status , Patient's Chart, lab work & pertinent test results  History of Anesthesia Complications Negative for: history of anesthetic complications  Airway Mallampati: III  TM Distance: >3 FB Neck ROM: Full    Dental no notable dental hx.    Pulmonary neg pulmonary ROS, neg sleep apnea, neg COPD,    breath sounds clear to auscultation- rhonchi (-) wheezing      Cardiovascular Exercise Tolerance: Good (-) hypertension(-) CAD, (-) Past MI, (-) Cardiac Stents and (-) CABG  Rhythm:Regular Rate:Normal - Systolic murmurs and - Diastolic murmurs    Neuro/Psych negative neurological ROS  negative psych ROS   GI/Hepatic Neg liver ROS, GERD  ,  Endo/Other  negative endocrine ROSneg diabetes  Renal/GU negative Renal ROS     Musculoskeletal negative musculoskeletal ROS (+)   Abdominal (+) - obese,   Peds  Hematology negative hematology ROS (+)   Anesthesia Other Findings Past Medical History: No date: Allergy 2005: Cancer [facility])     Comment:  prostate 2012: Colon polyp     Comment:  3 polyps No date: GERD (gastroesophageal reflux disease) No date: Hyperlipidemia No date: Internal hemorrhoids 1992: Pituitary adenoma ([place]) 2005: Prostate cancer ([place])   Reproductive/Obstetrics                             Anesthesia Physical Anesthesia Plan  ASA: II  Anesthesia Plan: General   Post-op Pain Management:    Induction: Intravenous  PONV Risk Score and Plan: 1 and Propofol infusion  Airway Management Planned: Natural Airway  Additional Equipment:   Intra-op Plan:   Post-operative Plan:   Informed Consent: I have reviewed the patients History and Physical, chart, labs and discussed the procedure including the risks, benefits and alternatives for the proposed  anesthesia with the patient or authorized representative who has indicated his/her understanding and acceptance.   Dental advisory given  Plan Discussed with: CRNA and Anesthesiologist  Anesthesia Plan Comments:         Anesthesia Quick Evaluation

## 2017-04-09 NOTE — Anesthesia Post-op Follow-up Note (Signed)
Anesthesia QCDR form completed.        

## 2017-04-09 NOTE — Op Note (Signed)
[facility] Patient Name: Zachary Hayden Procedure Date: 04/09/2017 12:52 PM MRN: [id number] Account #: [id number] Date of Birth: [birth year] Admit Type: Outpatient Age: 67 Room: [facility] ENDO ROOM 4 Gender: Male Note Status: Finalized Procedure:            Colonoscopy Indications:          High risk colon cancer surveillance: Personal history                        of colonic polyps Providers:            Benay Pike. Alice Reichert MD, MD Referring MD:         Dion Body (Referring MD) Medicines:            Propofol per Anesthesia Complications:        No immediate complications. Procedure:            Pre-Anesthesia Assessment:                       - The risks and benefits of the procedure and the                        sedation options and risks were discussed with the                        patient. All questions were answered and informed                        consent was obtained.                       - Patient identification and proposed procedure were                        verified prior to the procedure by the nurse. The                        procedure was verified in the procedure room.                       - ASA Grade Assessment: II - A patient with mild                        systemic disease.                       - After reviewing the risks and benefits, the patient                        was deemed in satisfactory condition to undergo the                        procedure.                       After obtaining informed consent, the colonoscope was                        passed under direct vision. Throughout the procedure,  the patient's blood pressure, pulse, and oxygen                        saturations were monitored continuously. The                        Colonoscope was introduced through the anus and                        advanced to the the cecum, identified by appendiceal                        orifice and  ileocecal valve. The colonoscopy was                        performed without difficulty. The patient tolerated the                        procedure well. The quality of the bowel preparation                        was good. The ileocecal valve, appendiceal orifice, and                        rectum were photographed. Findings:      The perianal and digital rectal examinations were normal. Pertinent       negatives include normal sphincter tone and no palpable rectal lesions.      A 15 mm, non-bleeding polyp was found in the ileocecal valve. The polyp       was sessile. Area was unsuccessfully injected with 3 mL 3cc eleview for       a lift polypectomy. Biopsies were taken with a cold forceps for       histology. Verification of patient identification for the specimen was       done by the nurse using the patient's name, birth date and medical       record number. Estimated blood loss: none.      Non-bleeding internal hemorrhoids were found during retroflexion. The       hemorrhoids were Grade I (internal hemorrhoids that do not prolapse).      The exam was otherwise without abnormality. Impression:           - One 15 mm, non-bleeding polyp at the ileocecal valve.                        Treatment not successful. Biopsied.                       - Non-bleeding internal hemorrhoids.                       - The examination was otherwise normal. Recommendation:       - Patient has a contact number available for                        emergencies. The signs and symptoms of potential                        delayed complications were discussed with the patient.  Return to normal activities tomorrow. Written discharge                        instructions were provided to the patient.                       - Resume previous diet.                       - Continue present medications.                       - Await pathology results.                       - Repeat colonoscopy is  recommended for surveillance.                        The colonoscopy date will be determined after pathology                        results from today's exam become available for review.                       - The findings and recommendations were discussed with                        the patient and their spouse. Procedure Code(s):    --- Professional ---                       [phone], Colonoscopy, flexible; with directed submucosal                        injection(s), any substance                       23300, Colonoscopy, flexible; with biopsy, single or                        multiple Diagnosis Code(s):    --- Professional ---                       Z86.010, Personal history of colonic polyps                       D12.0, Benign neoplasm of cecum                       K64.0, First degree hemorrhoids CPT copyright 2016 American Medical Association. All rights reserved. The codes documented in this report are preliminary and upon coder review may  be revised to meet current compliance requirements. Efrain Sella MD, MD 04/09/2017 1:35:46 PM This report has been signed electronically. Number of Addenda: 0 Note Initiated On: 04/09/2017 12:52 PM Scope Withdrawal Time: 0 hours 14 minutes 47 seconds  Total Procedure Duration: 0 hours 20 minutes 26 seconds       [facility]

## 2017-04-09 NOTE — Interval H&P Note (Signed)
History and Physical Interval Note:  04/09/2017 12:46 PM  Zachary Hayden  has presented today for surgery, with the diagnosis of GERD ADEN POLYPS OF COLON  The various methods of treatment have been discussed with the patient and family. After consideration of risks, benefits and other options for treatment, the patient has consented to  Procedure(s): ESOPHAGOGASTRODUODENOSCOPY (EGD) WITH PROPOFOL (N/A) COLONOSCOPY WITH PROPOFOL (N/A) as a surgical intervention .  The patient's history has been reviewed, patient examined, no change in status, stable for surgery.  I have reviewed the patient's chart and labs.  Questions were answered to the patient's satisfaction.     [place], [place]

## 2017-04-09 NOTE — Transfer of Care (Signed)
Immediate Anesthesia Transfer of Care Note  Patient: ALAMIN MCCUISTON III  Procedure(s) Performed: ESOPHAGOGASTRODUODENOSCOPY (EGD) WITH PROPOFOL (N/A ) COLONOSCOPY WITH PROPOFOL (N/A )  Patient Location: PACU  Anesthesia Type:General  Level of Consciousness: sedated  Airway & Oxygen Therapy: Patient Spontanous Breathing and Patient connected to nasal cannula oxygen  Post-op Assessment: Report given to RN  Post vital signs: Reviewed and stable  Last Vitals:  Vitals:   04/09/17 1218  BP: 125/82  Pulse: 80  Resp: 16  Temp: (!) 36 C  SpO2: 100%    Last Pain:  Vitals:   04/09/17 1218  TempSrc: Tympanic         Complications: No apparent anesthesia complications

## 2017-04-09 NOTE — Anesthesia Postprocedure Evaluation (Signed)
Anesthesia Post Note  Patient: Zachary Hayden  Procedure(s) Performed: ESOPHAGOGASTRODUODENOSCOPY (EGD) WITH PROPOFOL (N/A ) COLONOSCOPY WITH PROPOFOL (N/A )  Patient location during evaluation: Endoscopy Anesthesia Type: General Level of consciousness: awake and alert and oriented Pain management: pain level controlled Vital Signs Assessment: post-procedure vital signs reviewed and stable Respiratory status: spontaneous breathing, nonlabored ventilation and respiratory function stable Cardiovascular status: blood pressure returned to baseline and stable Postop Assessment: no signs of nausea or vomiting Anesthetic complications: no     Last Vitals:  Vitals:   04/09/17 1354 04/09/17 1404  BP: 128/83 (!) 147/84  Pulse: 73 69  Resp: 19 14  Temp:    SpO2: 100% 100%    Last Pain:  Vitals:   04/09/17 1334  TempSrc: Tympanic                 Vanessa Kampf

## 2017-04-09 NOTE — H&P (Signed)
Outpatient short stay form Pre-procedure 04/09/2017 12:45 PM Zachary Hayden, M.D.  Primary Physician: Meriel Flavors, M.D.  Reason for visit:  Long term history of GERD, Personal hx of tubular adenomatous polyp (2008).   History of present illness:  Patient has a long term hx of GERD for over 20 years. Denies dysphagia or abdominal pain. Has a remote hx of colon adenoma. Repeat colonoscopy in 2010 and 2013 showed no polyps.     Current Facility-Administered Medications:  .  0.9 %  sodium chloride infusion, , Intravenous, Continuous, [place], Benay Pike, MD  Medications Prior to Admission  Medication Sig Dispense Refill Last Dose  . aspirin EC 81 MG tablet Take 81 mg by mouth daily.   Past Week at Unknown time  . calcium carbonate (OS-CAL) 600 MG TABS tablet Take 600 mg by mouth.   Past Week at Unknown time  . docusate sodium (COLACE) 100 MG capsule Take 100 mg by mouth 2 (two) times daily.   Past Week at Unknown time  . Omega-3 Fatty Acids (FISH OIL) 500 MG CAPS Take 1 capsule by mouth daily.   Past Week at Unknown time  . ranitidine (ZANTAC) 75 MG tablet Take 150 mg by mouth daily.    Past Week at Unknown time  . therapeutic multivitamin-minerals (THERAGRAN-M) tablet Take 1 tablet by mouth daily.   04/08/2017 at Unknown time  . vitamin C (ASCORBIC ACID) 500 MG tablet Take 500 mg by mouth daily.   Past Week at Unknown time  . cabergoline (DOSTINEX) 0.5 MG tablet Take 0.5 tablets (0.25 mg total) by mouth once a week. 10 tablet 2 Taking  . diphenhydrAMINE (BENADRYL) 25 MG tablet Take 25 mg by mouth daily.   Taking  . flunisolide (NASALIDE) 25 MCG/ACT (0.025%) SOLN Place 1 spray into the nose 2 (two) times daily. 25 mL 5 Taking  . Red Yeast Rice Extract (RED YEAST RICE PO) Take by mouth.   Taking  . sildenafil (VIAGRA) 100 MG tablet Take 100 mg by mouth as needed for erectile dysfunction.   Taking  . Testosterone 20.25 MG/ACT (1.62%) GEL Apply 40.5 mg on skin before bedtime 75 g 1       No Known Allergies   Past Medical History:  Diagnosis Date  . Allergy   . Cancer [facility]) 2005   prostate  . Colon polyp 2012   3 polyps  . GERD (gastroesophageal reflux disease)   . Hyperlipidemia   . Internal hemorrhoids   . Pituitary adenoma ([place]) 1992  . Prostate cancer [facility]) 2005    Review of systems:      Physical Exam  Gen: Alert, oriented. Appears stated age.  HEENT: [place]/AT. PERRLA. Lungs: CTA, no wheezes. CV: RR nl S1, S2. Abd: soft, benign, no masses. BS+ Ext: No edema. Pulses 2+    Planned procedures: Proceed with EGD and colonoscopy. The patient understands the nature of the planned procedure, indications, risks, alternatives and potential complications including but not limited to bleeding, infection, perforation, damage to internal organs and possible oversedation/side effects from anesthesia. The patient agrees and gives consent to proceed.  Please refer to procedure notes for findings, recommendations and patient disposition/instructions.    Zachary Hayden, M.D. Gastroenterology 04/09/2017  12:45 PM

## 2017-04-10 ENCOUNTER — Encounter: Payer: Self-pay | Admitting: Internal Medicine

## 2017-04-11 LAB — SURGICAL PATHOLOGY

## 2017-06-12 ENCOUNTER — Encounter: Payer: Self-pay | Admitting: Internal Medicine

## 2017-06-16 ENCOUNTER — Other Ambulatory Visit: Payer: Self-pay

## 2017-06-16 MED ORDER — TESTOSTERONE 20.25 MG/ACT (1.62%) TD GEL: TRANSDERMAL | 1 refills | Status: DC

## 2017-07-02 ENCOUNTER — Other Ambulatory Visit: Payer: Self-pay

## 2017-07-02 MED ORDER — TESTOSTERONE 20.25 MG/ACT (1.62%) TD GEL: TRANSDERMAL | 1 refills | Status: DC

## 2017-07-23 DIAGNOSIS — N5201 Erectile dysfunction due to arterial insufficiency: Secondary | ICD-10-CM | POA: Diagnosis not present

## 2017-07-23 DIAGNOSIS — E291 Testicular hypofunction: Secondary | ICD-10-CM | POA: Diagnosis not present

## 2017-07-23 DIAGNOSIS — D400 Neoplasm of uncertain behavior of prostate: Secondary | ICD-10-CM | POA: Diagnosis not present

## 2017-07-23 DIAGNOSIS — C61 Malignant neoplasm of prostate: Secondary | ICD-10-CM | POA: Diagnosis not present

## 2017-07-24 ENCOUNTER — Encounter: Payer: Self-pay | Admitting: Internal Medicine

## 2017-08-19 DIAGNOSIS — E7800 Pure hypercholesterolemia, unspecified: Secondary | ICD-10-CM | POA: Diagnosis not present

## 2017-08-19 DIAGNOSIS — R030 Elevated blood-pressure reading, without diagnosis of hypertension: Secondary | ICD-10-CM | POA: Diagnosis not present

## 2017-08-26 DIAGNOSIS — Z23 Encounter for immunization: Secondary | ICD-10-CM | POA: Diagnosis not present

## 2017-08-26 DIAGNOSIS — Z0000 Encounter for general adult medical examination without abnormal findings: Secondary | ICD-10-CM | POA: Diagnosis not present

## 2017-08-26 DIAGNOSIS — E7800 Pure hypercholesterolemia, unspecified: Secondary | ICD-10-CM | POA: Diagnosis not present

## 2017-09-23 ENCOUNTER — Other Ambulatory Visit: Payer: Self-pay | Admitting: Internal Medicine

## 2017-10-09 ENCOUNTER — Encounter: Payer: Self-pay | Admitting: Internal Medicine

## 2017-10-10 ENCOUNTER — Encounter: Payer: Self-pay | Admitting: Internal Medicine

## 2017-10-10 ENCOUNTER — Ambulatory Visit (INDEPENDENT_AMBULATORY_CARE_PROVIDER_SITE_OTHER): Payer: PRIVATE HEALTH INSURANCE | Admitting: Internal Medicine

## 2017-10-10 VITALS — BP 140/80 | HR 62 | Ht 71.0 in | Wt 181.8 lb

## 2017-10-10 DIAGNOSIS — N62 Hypertrophy of breast: Secondary | ICD-10-CM | POA: Diagnosis not present

## 2017-10-10 DIAGNOSIS — E291 Testicular hypofunction: Secondary | ICD-10-CM | POA: Diagnosis not present

## 2017-10-10 DIAGNOSIS — E229 Hyperfunction of pituitary gland, unspecified: Secondary | ICD-10-CM

## 2017-10-10 DIAGNOSIS — D352 Benign neoplasm of pituitary gland: Secondary | ICD-10-CM

## 2017-10-10 DIAGNOSIS — E221 Hyperprolactinemia: Secondary | ICD-10-CM

## 2017-10-10 NOTE — Patient Instructions (Signed)
Please stop at the lab.  Please come back for a follow-up appointment in 1 year.  

## 2017-10-10 NOTE — Progress Notes (Signed)
Patient ID: Zachary Hayden, male   DOB: 26-Jul-1950, 67 y.o.   MRN: [id number]   HPI  Zachary Hayden is a 67 y.o.-year-old male, initially referred by his PCP, Dion Body, MD, for management of a prolactinoma and hypogonadotropic hypogonadism. Last visit 6 months ago.  Since last visit, he started testosterone gel per Dr. Eliberto Ivory, his urologist.  He was initially on 2 pumps daily, now on 1 pump daily, with good results in terms of improved erections and libido.  He has occasional hot flashes, but not bothersome.  He does continue to have enlarged breasts, but without pain or discomfort.  No galactorrhea.  Reviewed and addended patient's pituitary tumor history: Pt was dx'ed with hyperprolactinemia since 2000. He was first diagnosed  On imaging in 2005 when his prolactin was 960, and pituitary MRI showed a 15 x 15  x 20 mm lesion. He has had good suppression of his prolactin with bromocriptine, but when it was tried to be tapered off after 3 years 06/2009, his prolactin levels started to rise in normal range. Otherwise it has been well suppressed . He was on bromocriptine 2.5 twice daily, no problems with fatigue,nausea, LH, or headaches. No breast discharge. Has stable mild enlargement of the breasts bilaterally without masses.  Additionally, he had hypogonadism when the prolactin was high, which initially recovered when the prolactin was suppressed, but recurred recently despite PRL being normal. He also developed prostate cancer treated with prostatectomy.  He has low libido, mild erectile dysfunction. In the past, he has noticed the significant decrease in libido when his prolactin has risen and testosterone has declined. Having occasional hot flashes , no muscle weakness.   He was previously on cabergoline for approximately a year but because of price, ~3 years ago tried Bromocriptine.  He also had a trial off bromocriptine in 2016, however, his prolactin increased.  He was able to restart  cabergoline in 05/2014.   Labs and imaging reports reviewed: Component     Latest Ref Rng & Units 02/19/2017  RBC     4.22 - 5.81 Mil/uL 4.67  Hemoglobin     13.0 - 17.0 g/dL 13.9  HCT     39.0 - 52.0 % 41.2  Testosterone     264 - 916 ng/dL 92 (L)  Testosterone Free     6.6 - 18.1 pg/mL 2.0 (L)  Sex Horm Binding Glob, Serum     19.3 - 76.4 nmol/L 118.1 (H)  Prolactin     2.0 - 18.0 ng/mL 5.1   Previously: 11/1998 - PRL 548.8, TSH 1.91, T4 8.6 2001- PRL 660 01/2003 - FSH<0.3, LH <0.3, PRL 939 02/2003 - MRI [facility]- enlarged pituitary gland slight to right of midline, homogenous enhancement post contrast, no stalk deviation, no impingement on optic chiasm, no narrowing of cavernous sinus, ICA; tumor 1.5 x1.5 x2cm, enhancing nodule within pituitary as well 04/2003- TSH 2.69, free T4 0.95, PRL 862, cortisol 13.4, Testosterone 298 ( 179-756), IGF-1 178 ( 90-360) 11/2003 - Testosterone low at 20- was on Androgel at that time 01/2004 - PRL 9 04/2004 - PRL 6, Testosterone 354 01/2005 - MRI: 37mm pituitary mass, decreased T1 signal , increased T2 and hypoenhancement right aspect of sella, displacing pituitary to left 02/2005- PRL 3, Testosterone 257 02/2006-PRL 5, Testosterone 318 03/2008 PRL 3, Testosterone 278 02/2009- PRL 5, Testosterone 310>>dose reduced to bromocriptine 2.5 mg daily 05/2009 -PRL 7, Testos 271 10/2009-PRL 10 03/2010 -PRL 9>>dose increased to 5mg  daily 03/2011-PRL 3,  Testos 330 01/2012 - MRI pituitary , [facility]- compared to 2010: 1.5 x 1.1 cm enhacing mass in the right sella involving right cavernous sinus and displacing pitiutary stalk to left (grossly unchanged appearance as compared to prior) 02/2012-PRL 6, Testosterone 329 2015 - VF normal. 04/19/2014 MRI: stable prolactinoma within right side of pituitary ( 1.5 x1.2 x1.2 cm) compared to 2013 scan. There was mild left deviation of the pituitary stalk, unchanged.  03/2014: Testosterone: 205 (179-756), am, fasting 06/2015:  nonfasting: t Testosterone: 174 (before the weight loss) 10/12/2015: Total testosterone 190 (264-916), SHBG 122.6 (19.3-76.4), free testosterone 2.1 (6.6-18.1)  07/2016: nonfasting: t testosterone 144; estradiol 46.2 at urologist office, PSA 0.0  On cabergoline 0.25 mg once a week, dose decreased 10/11/2014.  He tolerates this well, without headaches, dizziness, congestion.  Reviewed previous prolactin levels available: Lab Results  Component Value Date   PROLACTIN 5.1 02/19/2017   PROLACTIN 4.4 10/10/2016   PROLACTIN 5.9 10/12/2015   PROLACTIN 4.3 12/27/2014   PROLACTIN 2.4 10/11/2014   We started Clomid (with Urology consent): 25 mg daily.  He developed more hot flashes on this and no increased libido or improvement in erections.  Therefore, at last visit, we stopped Clomid and I suggested to start testosterone replacement.  This is now managed by urology per patient's preference. He is on 5g Androgel daily. Last testosterone normal reportedly.  He also has a history of hyperlipidemia (off statin; on Red yeast rice), GERD.  ROS: + See HPI Constitutional: no weight gain/no weight loss, no fatigue, + occasional hot flashes Eyes: no blurry vision, no xerophthalmia ENT: no sore throat, no nodules palpated in throat, no dysphagia, no odynophagia, no hoarseness Cardiovascular: no CP/no SOB/no palpitations/no leg swelling Respiratory: no cough/no SOB/no wheezing Gastrointestinal: no N/no V/no D/no C/no acid reflux Musculoskeletal: no muscle aches/no joint aches Skin: no rashes, no hair loss Neurological: no tremors/no numbness/no tingling/no dizziness + Improved erections  I reviewed pt's medications, allergies, PMH, social hx, family hx, and changes were documented in the history of present illness. Otherwise, unchanged from my initial visit note.  Past Medical History:  Diagnosis Date  . Allergy   . Cancer [facility]) 2005   prostate  . Colon polyp 2012   3 polyps  . GERD  (gastroesophageal reflux disease)   . Hyperlipidemia   . Internal hemorrhoids   . Pituitary adenoma ([place]) 1992  . Prostate cancer ([place]) 2005   Past Surgical History:  Procedure Laterality Date  . COLONOSCOPY  2010, 2012, 2014   Dr. Dionne Milo  . COLONOSCOPY WITH PROPOFOL N/A 04/09/2017   Procedure: COLONOSCOPY WITH PROPOFOL;  Surgeon: Toledo, Benay Pike, MD;  Location: [facility] ENDOSCOPY;  Service: Gastroenterology;  Laterality: N/A;  . ESOPHAGOGASTRODUODENOSCOPY (EGD) WITH PROPOFOL N/A 04/09/2017   Procedure: ESOPHAGOGASTRODUODENOSCOPY (EGD) WITH PROPOFOL;  Surgeon: Toledo, Benay Pike, MD;  Location: [facility] ENDOSCOPY;  Service: Gastroenterology;  Laterality: N/A;  . PROSTATE SURGERY  2005   radical  . PROSTATECTOMY     Social History   Social History  . Marital Status: Married    Spouse Name: N/A  . Number of Children: 4   Occupational History  .  [employer]   Social History Main Topics  . Smoking status: Never Smoker   . Smokeless tobacco: Never Used  . Alcohol Use: 0.0 oz/week    0 Standard drinks or equivalent per week     Comment: glass of wine monthly   . Drug Use: No   Current Outpatient Medications on File Prior to  Visit  Medication Sig Dispense Refill  . aspirin EC 81 MG tablet Take 81 mg by mouth daily.    . cabergoline (DOSTINEX) 0.5 MG tablet TAKE 1/2 TABLET ONCE A WEEK 10 tablet 2  . calcium carbonate (OS-CAL) 600 MG TABS tablet Take 600 mg by mouth.    . diphenhydrAMINE (BENADRYL) 25 MG tablet Take 25 mg by mouth daily.    Marland Kitchen docusate sodium (COLACE) 100 MG capsule Take 100 mg by mouth 2 (two) times daily.    . flunisolide (NASALIDE) 25 MCG/ACT (0.025%) SOLN Place 1 spray into the nose 2 (two) times daily. 25 mL 5  . Omega-3 Fatty Acids (FISH OIL) 500 MG CAPS Take 1 capsule by mouth daily.    . ranitidine (ZANTAC) 75 MG tablet Take 150 mg by mouth daily.     . Red Yeast Rice Extract (RED YEAST RICE PO) Take by mouth.    . sildenafil (VIAGRA) 100 MG tablet Take 100 mg by  mouth as needed for erectile dysfunction.    . Testosterone 20.25 MG/ACT (1.62%) GEL Apply 40.5 mg on skin before bedtime 75 g 1  . therapeutic multivitamin-minerals (THERAGRAN-M) tablet Take 1 tablet by mouth daily.    . vitamin C (ASCORBIC ACID) 500 MG tablet Take 500 mg by mouth daily.     No current facility-administered medications on file prior to visit.    No Known Allergies Family History  Problem Relation Age of Onset  . Other Cousin   . Heart disease Father   . Hyperlipidemia Father   . Hypertension Father    PE: BP 140/80   Pulse 62   Ht 5\' 11"  (1.803 m)   Wt 181 lb 12.8 oz (82.5 kg)   SpO2 98%   BMI 25.36 kg/m  Body mass index is 25.36 kg/m. Wt Readings from Last 3 Encounters:  10/10/17 181 lb 12.8 oz (82.5 kg)  04/09/17 172 lb (78 kg)  02/19/17 172 lb 9.6 oz (78.3 kg)   Constitutional: + normal weight, in NAD Eyes: PERRLA, EOMI, no exophthalmos ENT: moist mucous membranes, no thyromegaly, no cervical lymphadenopathy Cardiovascular: RRR, No MRG Respiratory: CTA B Gastrointestinal: abdomen soft, NT, ND, BS+ Musculoskeletal: no deformities, strength intact in all 4 Skin: moist, warm, no rashes Neurological: no tremor with outstretched hands, DTR normal in all 4  ASSESSMENT: 1. Pituitary tumor  2. Prolactinoma  3. Hypogonadotropic hypogonadism  4. Gynecomastia  PLAN:  1. And 2.  Patient with history of prolactinoma diagnosed in 2000.  This has decreased in size after starting treatment with bromocriptine and then remained stable at 1.5 cm based on last pituitary MRI from 04/19/2014.  He is currently on a stable dose of cabergoline (started 05/2014), 0.25 mg weekly (reduced from twice a week in 09/2016).  Prolactin levels have remained low, including the level obtained at last visit.   - He does have some hot flashes which may be from his hypogonadism.  He tolerates cabergoline well. - We discussed that we may be able to give him a drug holiday, however, if  that tumor size remains 1.5 cm, I doubt that this will be possible. - Latest visual field was in 2015 and I do not feel that he needs another one now - We will plan to obtain another pituitary MRI approximately 5 years after he started cabergoline or if his prolactin increases - We did discuss again today about the possibility of taking a drug holiday, however, he has been off dopamine agonist in  the past with an increase in prolactin, so this will not likely be possible for him. - We will check a prolactin level today and then I will see him back in a year  3. Hypogonadotropic hypogonadism - Patient has had low testosterone levels due to his high prolactin levels in the past and the levels remained low despite efficacy of the prolactinoma treatment.  He did complain of low libido and problems with erections and he was interested in starting treatment for his low testosterone level.  We discussed at last visit that we have to be very careful with this due to previous history of prostate cancer.  Of note, his urologist gave [place] the ok to use testosterone supplement. Since he also had a high estrogen level at the time at his visit with a urologist in 07/2016, I suggested to try Clomid first.  We started this in 09/2016.  He noticed more hot flashes and not perceivable improvement in libido or erections. - Reviewed previous testosterone levels: testosterone level was low (total testosterone 144), but he was not fasting at the time of his last visit with his urologist (he usually h sees him in June of every year); we repeated his labs on 10/10/2016, fasting, and the testosterone was still low (total testosterone 158, free testosterone 2.3). Most recent estrogen level has been normal (10/10/2016: Free estradiol level 11, normal <29) - We tried Clomid but this did not work for him so I suggested testosterone treatment. - This is now managed by Dr. Eliberto Ivory.  He is doing great on testosterone gel.  4. Gynecomastia -  Estrogen level normal before starting Clomid - No breast discomfort, only breast enlargement - We discussed that this is most likely permanent  Office Visit on 10/10/2017  Component Date Value Ref Range Status  . Prolactin 10/10/2017 5.5  2.0 - 18.0 ng/mL Final   Excellent prolactin level.   CC: Urologist, Dr. Robbie Louis, MD PhD [facility]

## 2017-10-11 LAB — PROLACTIN: Prolactin: 5.5 ng/mL (ref 2.0–18.0)

## 2017-11-25 DIAGNOSIS — H6123 Impacted cerumen, bilateral: Secondary | ICD-10-CM | POA: Diagnosis not present

## 2017-11-25 DIAGNOSIS — H9319 Tinnitus, unspecified ear: Secondary | ICD-10-CM | POA: Diagnosis not present

## 2018-01-09 ENCOUNTER — Other Ambulatory Visit: Payer: Self-pay | Admitting: Internal Medicine

## 2018-01-09 NOTE — Telephone Encounter (Signed)
Please refill if appropriate-unable to print

## 2018-01-12 NOTE — Telephone Encounter (Signed)
OK 

## 2018-01-28 DIAGNOSIS — C61 Malignant neoplasm of prostate: Secondary | ICD-10-CM | POA: Diagnosis not present

## 2018-01-28 DIAGNOSIS — D400 Neoplasm of uncertain behavior of prostate: Secondary | ICD-10-CM | POA: Diagnosis not present

## 2018-01-28 DIAGNOSIS — N401 Enlarged prostate with lower urinary tract symptoms: Secondary | ICD-10-CM | POA: Diagnosis not present

## 2018-01-28 DIAGNOSIS — E291 Testicular hypofunction: Secondary | ICD-10-CM | POA: Diagnosis not present

## 2018-01-28 DIAGNOSIS — Z79899 Other long term (current) drug therapy: Secondary | ICD-10-CM | POA: Diagnosis not present

## 2018-02-24 DIAGNOSIS — Z0000 Encounter for general adult medical examination without abnormal findings: Secondary | ICD-10-CM | POA: Diagnosis not present

## 2018-02-24 DIAGNOSIS — E7800 Pure hypercholesterolemia, unspecified: Secondary | ICD-10-CM | POA: Diagnosis not present

## 2018-03-03 DIAGNOSIS — E7800 Pure hypercholesterolemia, unspecified: Secondary | ICD-10-CM | POA: Diagnosis not present

## 2018-03-17 DIAGNOSIS — H2513 Age-related nuclear cataract, bilateral: Secondary | ICD-10-CM | POA: Diagnosis not present

## 2018-03-17 DIAGNOSIS — H524 Presbyopia: Secondary | ICD-10-CM | POA: Diagnosis not present

## 2018-03-17 DIAGNOSIS — H52223 Regular astigmatism, bilateral: Secondary | ICD-10-CM | POA: Diagnosis not present

## 2018-03-17 DIAGNOSIS — H5213 Myopia, bilateral: Secondary | ICD-10-CM | POA: Diagnosis not present

## 2018-07-02 DIAGNOSIS — E7800 Pure hypercholesterolemia, unspecified: Secondary | ICD-10-CM | POA: Diagnosis not present

## 2018-07-03 DIAGNOSIS — E7800 Pure hypercholesterolemia, unspecified: Secondary | ICD-10-CM | POA: Diagnosis not present

## 2018-07-29 DIAGNOSIS — N401 Enlarged prostate with lower urinary tract symptoms: Secondary | ICD-10-CM | POA: Diagnosis not present

## 2018-07-29 DIAGNOSIS — C61 Malignant neoplasm of prostate: Secondary | ICD-10-CM | POA: Diagnosis not present

## 2018-07-29 DIAGNOSIS — D400 Neoplasm of uncertain behavior of prostate: Secondary | ICD-10-CM | POA: Diagnosis not present

## 2018-07-29 DIAGNOSIS — E291 Testicular hypofunction: Secondary | ICD-10-CM | POA: Diagnosis not present

## 2018-07-29 DIAGNOSIS — Z79899 Other long term (current) drug therapy: Secondary | ICD-10-CM | POA: Diagnosis not present

## 2018-08-27 DIAGNOSIS — L578 Other skin changes due to chronic exposure to nonionizing radiation: Secondary | ICD-10-CM | POA: Diagnosis not present

## 2018-08-27 DIAGNOSIS — C4491 Basal cell carcinoma of skin, unspecified: Secondary | ICD-10-CM

## 2018-08-27 DIAGNOSIS — Z1283 Encounter for screening for malignant neoplasm of skin: Secondary | ICD-10-CM | POA: Diagnosis not present

## 2018-08-27 DIAGNOSIS — D225 Melanocytic nevi of trunk: Secondary | ICD-10-CM | POA: Diagnosis not present

## 2018-08-27 DIAGNOSIS — D1800 Hemangioma unspecified site: Secondary | ICD-10-CM | POA: Diagnosis not present

## 2018-08-27 DIAGNOSIS — C44612 Basal cell carcinoma of skin of right upper limb, including shoulder: Secondary | ICD-10-CM | POA: Diagnosis not present

## 2018-08-27 DIAGNOSIS — D485 Neoplasm of uncertain behavior of skin: Secondary | ICD-10-CM | POA: Diagnosis not present

## 2018-08-27 DIAGNOSIS — L814 Other melanin hyperpigmentation: Secondary | ICD-10-CM | POA: Diagnosis not present

## 2018-08-27 DIAGNOSIS — L570 Actinic keratosis: Secondary | ICD-10-CM | POA: Diagnosis not present

## 2018-08-27 DIAGNOSIS — L821 Other seborrheic keratosis: Secondary | ICD-10-CM | POA: Diagnosis not present

## 2018-08-27 HISTORY — DX: Basal cell carcinoma of skin, unspecified: C44.91

## 2018-10-12 ENCOUNTER — Other Ambulatory Visit: Payer: Self-pay

## 2018-10-14 ENCOUNTER — Encounter: Payer: Self-pay | Admitting: Internal Medicine

## 2018-10-14 ENCOUNTER — Ambulatory Visit: Payer: PPO | Admitting: Internal Medicine

## 2018-10-14 ENCOUNTER — Other Ambulatory Visit: Payer: Self-pay

## 2018-10-14 VITALS — BP 142/80 | HR 62 | Ht 71.0 in | Wt 184.0 lb

## 2018-10-14 DIAGNOSIS — D352 Benign neoplasm of pituitary gland: Secondary | ICD-10-CM | POA: Diagnosis not present

## 2018-10-14 DIAGNOSIS — E229 Hyperfunction of pituitary gland, unspecified: Secondary | ICD-10-CM

## 2018-10-14 DIAGNOSIS — E221 Hyperprolactinemia: Secondary | ICD-10-CM

## 2018-10-14 NOTE — Patient Instructions (Signed)
Please stop at the lab.  Continue Cabergoline 0.25 mg weekly.  Please come back for a follow-up appointment in 1 year. 

## 2018-10-14 NOTE — Progress Notes (Signed)
Patient ID: Zachary Hayden, male   DOB: [birth year], 68 y.o.   MRN: [id number]   HPI  Zachary Hayden is a 68 y.o.-year-old male, initially referred by his PCP, Dion Body, MD, for management of a prolactinoma and hypogonadotropic hypogonadism. Last visit 1 year ago.  Before last visit, he started testosterone gel per Dr. Yves Dill, his urologist.  He was initially on 2 pumps daily, now on 1 pump daily, with good results in terms of improved erections and libido.  He has occasional hot flashes, but not bothersome.  He continues to have enlarged breasts, but without discomfort.  No galactorrhea  Reviewed and addended patient's pituitary tumor history: Pt was dx'ed with hyperprolactinemia since 2000. He was first diagnosed  On imaging in 2005 when his prolactin was 960, and pituitary MRI showed a 15 x 15  x 20 mm lesion. He has had good suppression of his prolactin with bromocriptine, but when it was tried to be tapered off after 3 years 06/2009, his prolactin levels started to rise in normal range. Otherwise it has been well suppressed . He was on bromocriptine 2.5 twice daily, no problems with fatigue,nausea, LH, or headaches. No breast discharge. Has stable mild enlargement of the breasts bilaterally without masses.  Additionally, he had hypogonadism when the prolactin was high, which initially recovered when the prolactin was suppressed, but recurred recently despite PRL being normal. He also developed prostate cancer treated with prostatectomy.  He has low libido, mild erectile dysfunction. In the past, he has noticed the significant decrease in libido when his prolactin has risen and testosterone has declined. Having occasional hot flashes , no muscle weakness.   He was previously on cabergoline for approximately a year but because of price, ~3 years ago tried Bromocriptine.  He also had a trial off bromocriptine in 2016, however, his prolactin increased.  She was able to restart Cabergoline  02/2014.  Labs and imaging reports reviewed: Component     Latest Ref Rng & Units 02/19/2017  RBC     4.22 - 5.81 Mil/uL 4.67  Hemoglobin     13.0 - 17.0 g/dL 13.9  HCT     39.0 - 52.0 % 41.2  Testosterone     264 - 916 ng/dL 92 (L)  Testosterone Free     6.6 - 18.1 pg/mL 2.0 (L)  Sex Horm Binding Glob, Serum     19.3 - 76.4 nmol/L 118.1 (H)  Prolactin     2.0 - 18.0 ng/mL 5.1   Previously: 11/1998 - PRL 548.8, TSH 1.91, T4 8.6 2001- PRL 660 01/2003 - FSH<0.3, LH <0.3, PRL 939 02/2003 - MRI [facility]- enlarged pituitary gland slight to right of midline, homogenous enhancement post contrast, no stalk deviation, no impingement on optic chiasm, no narrowing of cavernous sinus, ICA; tumor 1.5 x1.5 x2cm, enhancing nodule within pituitary as well 04/2003- TSH 2.69, free T4 0.95, PRL 862, cortisol 13.4, Testosterone 298 ( 179-756), IGF-1 178 ( 90-360) 11/2003 - Testosterone low at 20- was on Androgel at that time 01/2004 - PRL 9 04/2004 - PRL 6, Testosterone 354 01/2005 - MRI: 74mm pituitary mass, decreased T1 signal , increased T2 and hypoenhancement right aspect of sella, displacing pituitary to left 02/2005- PRL 3, Testosterone 257 02/2006-PRL 5, Testosterone 318 03/2008 PRL 3, Testosterone 278 02/2009- PRL 5, Testosterone 310>>dose reduced to bromocriptine 2.5 mg daily 05/2009 -PRL 7, Testos 271 10/2009-PRL 10 03/2010 -PRL 9>>dose increased to 5mg  daily 03/2011-PRL 3, Testos 330 01/2012 - MRI  pituitary , [facility]- compared to 2010: 1.5 x 1.1 cm enhacing mass in the right sella involving right cavernous sinus and displacing pitiutary stalk to left (grossly unchanged appearance as compared to prior) 02/2012-PRL 6, Testosterone 329 2015 - VF normal. 04/19/2014 MRI: stable prolactinoma within right side of pituitary ( 1.5 x1.2 x1.2 cm) compared to 2013 scan. There was mild left deviation of the pituitary stalk, unchanged.  03/2014: Testosterone: 205 (179-756), am, fasting 06/2015: nonfasting: t  Testosterone: 174 (before the weight loss) 10/12/2015: Total testosterone 190 (264-916), SHBG 122.6 (19.3-76.4), free testosterone 2.1 (6.6-18.1)  07/2016: nonfasting: t testosterone 144; estradiol 46.2 at urologist office, PSA 0.0  He has been on cabergoline 0.25 mg once a week since 09/2016.  He tolerates this well, without headaches, dizziness, congestion.  Reviewed previous prolactin levels: Lab Results  Component Value Date   PROLACTIN 5.5 10/10/2017   PROLACTIN 5.1 02/19/2017   PROLACTIN 4.4 10/10/2016   PROLACTIN 5.9 10/12/2015   PROLACTIN 4.3 12/27/2014   We started Clomid (with Urology consent): 25 mg daily.  He developed more hot flashes on this and no increased libido or improvement in erections.  Therefore, at last visit, we stopped Clomid and I suggested to start testosterone replacement.  This is now managed by urology per patient's preference.  He is on 5 g testosterone daily - compounded now ([facility] in [place]).  Latest testosterone level was normal reportedly in 07/2018.  He also has a history of HL (off statin; on Red yeast rice), GERD.  ROS: + See HPI Constitutional: no weight gain/no weight loss, no fatigue, no subjective hyperthermia, no subjective hypothermia Eyes: no blurry vision, no xerophthalmia ENT: no sore throat, no nodules palpated in neck, no dysphagia, no odynophagia, no hoarseness Cardiovascular: no CP/no SOB/no palpitations/no leg swelling Respiratory: no cough/no SOB/no wheezing Gastrointestinal: no N/no V/no D/no C/no acid reflux Musculoskeletal: no muscle aches/no joint aches Skin: no rashes, no hair loss Neurological: no tremors/no numbness/no tingling/no dizziness  I reviewed pt's medications, allergies, PMH, social hx, family hx, and changes were documented in the history of present illness. Otherwise, unchanged from my initial visit note. Started Pepcid.  Past Medical History:  Diagnosis Date  . Allergy   . Cancer [facility])  2005   prostate  . Colon polyp 2012   3 polyps  . GERD (gastroesophageal reflux disease)   . Hyperlipidemia   . Internal hemorrhoids   . Pituitary adenoma ([place]) 1992  . Prostate cancer ([place]) 2005   Past Surgical History:  Procedure Laterality Date  . COLONOSCOPY  2010, 2012, 2014   Dr. Dionne Milo  . COLONOSCOPY WITH PROPOFOL N/A 04/09/2017   Procedure: COLONOSCOPY WITH PROPOFOL;  Surgeon: Toledo, Benay Pike, MD;  Location: [facility] ENDOSCOPY;  Service: Gastroenterology;  Laterality: N/A;  . ESOPHAGOGASTRODUODENOSCOPY (EGD) WITH PROPOFOL N/A 04/09/2017   Procedure: ESOPHAGOGASTRODUODENOSCOPY (EGD) WITH PROPOFOL;  Surgeon: Toledo, Benay Pike, MD;  Location: [facility] ENDOSCOPY;  Service: Gastroenterology;  Laterality: N/A;  . PROSTATE SURGERY  2005   radical  . PROSTATECTOMY     Social History   Social History  . Marital Status: Married    Spouse Name: N/A  . Number of Children: 4   Occupational History  .  [employer]   Social History Main Topics  . Smoking status: Never Smoker   . Smokeless tobacco: Never Used  . Alcohol Use: 0.0 oz/week    0 Standard drinks or equivalent per week     Comment: glass of wine monthly   .  Drug Use: No   Current Outpatient Medications on File Prior to Visit  Medication Sig Dispense Refill  . Arginine 1000 MG TABS Take by mouth.    Marland Kitchen aspirin EC 81 MG tablet Take 81 mg by mouth daily.    . cabergoline (DOSTINEX) 0.5 MG tablet TAKE 1/2 TABLET ONCE A WEEK 10 tablet 2  . calcium carbonate (OS-CAL) 600 MG TABS tablet Take 600 mg by mouth.    . diphenhydrAMINE (BENADRYL) 25 MG tablet Take 25 mg by mouth daily.    Marland Kitchen docusate sodium (COLACE) 100 MG capsule Take 100 mg by mouth 2 (two) times daily.    . flunisolide (NASALIDE) 25 MCG/ACT (0.025%) SOLN Place 1 spray into the nose 2 (two) times daily. 25 mL 5  . Omega-3 Fatty Acids (FISH OIL) 500 MG CAPS Take 1 capsule by mouth daily.    . ranitidine (ZANTAC) 75 MG tablet Take 150 mg by mouth daily.     . Red Yeast  Rice Extract (RED YEAST RICE PO) Take by mouth.    . sildenafil (VIAGRA) 100 MG tablet Take 100 mg by mouth as needed for erectile dysfunction.    . Testosterone 20.25 MG/ACT (1.62%) GEL APPLY 40.5MG  (2 PUMPS) ON THE SKIN AT BEDTIME 75 g 2  . therapeutic multivitamin-minerals (THERAGRAN-M) tablet Take 1 tablet by mouth daily.    . vitamin C (ASCORBIC ACID) 500 MG tablet Take 500 mg by mouth daily.     No current facility-administered medications on file prior to visit.    No Known Allergies Family History  Problem Relation Age of Onset  . Other Cousin   . Heart disease Father   . Hyperlipidemia Father   . Hypertension Father    PE: There were no vitals taken for this visit. There is no height or weight on file to calculate BMI. Wt Readings from Last 3 Encounters:  10/10/17 181 lb 12.8 oz (82.5 kg)  04/09/17 172 lb (78 kg)  02/19/17 172 lb 9.6 oz (78.3 kg)   Constitutional: overweight, in NAD Eyes: PERRLA, EOMI, no exophthalmos ENT: moist mucous membranes, no thyromegaly, no cervical lymphadenopathy Cardiovascular: RRR, No MRG Respiratory: CTA B Gastrointestinal: abdomen soft, NT, ND, BS+ Musculoskeletal: no deformities, strength intact in all 4 Skin: moist, warm, no rashes Neurological: no tremor with outstretched hands, DTR normal in all 4  ASSESSMENT: 1. Pituitary tumor  2. Prolactinoma  3. Hypogonadotropic hypogonadism  PLAN:  1. And 2.   Patient with history of prolactinoma diagnosed in 2000. This has decreased in size after starting treatment with bromocriptine and then remained stable at 1.5 cm based on last pituitary MRI from 04/19/2014.  He is currently on a stable dose of Cabergoline (started 05/2014), 0.25 mg weekly (reduced from twice a week in 09/2016).  His prolactin levels have been normal, including the level obtained at last visit.   -He continues to have some hot flashes, which may be from his hypogonadism.  He tolerates Cabergoline well, without dizziness,  headaches, nausea, congestion. -We discussed about guidelines regarding drug holidays for his tumor, however, if the tumor size is large, 1.5 cm, I doubt that this would be possible.  -Latest visual field was in 2016 and this was normal.  I do not feel he needs another visual field now since there are no headaches and his prolactin levels are normal on treatment with a low-dose Cabergoline -will plan to repeat his MRI next year, 5 years from the prev. -We will check a prolactin  level now and see him back in 1 year  3. Hypogonadotropic hypogonadism - Patient has had low testosterone levels due to his high prolactin levels in the past and the levels remained low despite efficacy of the prolactinoma treatment.  He did complain of low libido and problems with erections and he was interested in starting treatment for his low testosterone level.  We discussed at last visit that we have to be very careful with this due to previous history of prostate cancer.  Of note, his urologist gave [place] the ok to use testosterone supplement. Since he also had a high estrogen level at the time at his visit with a urologist in 07/2016, I suggested to try Clomid first.  We started this in 09/2016.  He noticed more hot flashes and no perceived improvement in libido and erections - Reviewed previous testosterone levels: testosterone level was low (total testosterone 144), but he was not fasting at the time of his last visit with his urologist (he usually h sees him in June of every year); we repeated his labs on 10/10/2016, fasting, and the testosterone was still low (total testosterone 158, free testosterone 2.3).  Most recent estrogen level has been normal (10/10/2016: Free estradiol level 11, normal <29). Reportedly, his most recent testosterone from 07/2018 was normal. -I suggested testosterone treatment and he is cleared for this by his urologist, Dr. Yves Dill.  He started the patient on compounded testosterone cream (cheaper for  him) and he does great on this. -he will continue to follow with his urologist for this problem  Needs refills.  Office Visit on 10/14/2018  Component Date Value Ref Range Status  . Prolactin 10/14/2018 5.8  2.0 - 18.0 ng/mL Final   CC: Urologist, Dr. Robbie Louis, MD PhD [facility]

## 2018-10-15 LAB — PROLACTIN: Prolactin: 5.8 ng/mL (ref 2.0–18.0)

## 2018-10-15 MED ORDER — CABERGOLINE 0.5 MG PO TABS: ORAL_TABLET | ORAL | 3 refills | Status: DC

## 2018-11-04 DIAGNOSIS — E7800 Pure hypercholesterolemia, unspecified: Secondary | ICD-10-CM | POA: Diagnosis not present

## 2018-11-11 DIAGNOSIS — Z0000 Encounter for general adult medical examination without abnormal findings: Secondary | ICD-10-CM | POA: Diagnosis not present

## 2018-11-11 DIAGNOSIS — E7800 Pure hypercholesterolemia, unspecified: Secondary | ICD-10-CM | POA: Diagnosis not present

## 2018-12-03 DIAGNOSIS — L814 Other melanin hyperpigmentation: Secondary | ICD-10-CM | POA: Diagnosis not present

## 2018-12-03 DIAGNOSIS — D2230 Melanocytic nevi of unspecified part of face: Secondary | ICD-10-CM | POA: Diagnosis not present

## 2018-12-03 DIAGNOSIS — L299 Pruritus, unspecified: Secondary | ICD-10-CM | POA: Diagnosis not present

## 2018-12-03 DIAGNOSIS — D2260 Melanocytic nevi of unspecified upper limb, including shoulder: Secondary | ICD-10-CM | POA: Diagnosis not present

## 2018-12-03 DIAGNOSIS — L578 Other skin changes due to chronic exposure to nonionizing radiation: Secondary | ICD-10-CM | POA: Diagnosis not present

## 2018-12-03 DIAGNOSIS — D1800 Hemangioma unspecified site: Secondary | ICD-10-CM | POA: Diagnosis not present

## 2018-12-03 DIAGNOSIS — Z872 Personal history of diseases of the skin and subcutaneous tissue: Secondary | ICD-10-CM | POA: Diagnosis not present

## 2018-12-03 DIAGNOSIS — Z85828 Personal history of other malignant neoplasm of skin: Secondary | ICD-10-CM | POA: Diagnosis not present

## 2018-12-03 DIAGNOSIS — L821 Other seborrheic keratosis: Secondary | ICD-10-CM | POA: Diagnosis not present

## 2018-12-03 DIAGNOSIS — S0990XA Unspecified injury of head, initial encounter: Secondary | ICD-10-CM | POA: Diagnosis not present

## 2018-12-03 DIAGNOSIS — R202 Paresthesia of skin: Secondary | ICD-10-CM | POA: Diagnosis not present

## 2018-12-22 ENCOUNTER — Other Ambulatory Visit: Payer: Self-pay

## 2018-12-22 DIAGNOSIS — Z20822 Contact with and (suspected) exposure to covid-19: Secondary | ICD-10-CM

## 2018-12-24 LAB — NOVEL CORONAVIRUS, NAA: SARS-CoV-2, NAA: DETECTED — AB

## 2019-01-15 DIAGNOSIS — Z789 Other specified health status: Secondary | ICD-10-CM | POA: Diagnosis not present

## 2019-01-27 DIAGNOSIS — C61 Malignant neoplasm of prostate: Secondary | ICD-10-CM | POA: Diagnosis not present

## 2019-01-27 DIAGNOSIS — Z79899 Other long term (current) drug therapy: Secondary | ICD-10-CM | POA: Diagnosis not present

## 2019-01-27 DIAGNOSIS — D400 Neoplasm of uncertain behavior of prostate: Secondary | ICD-10-CM | POA: Diagnosis not present

## 2019-01-27 DIAGNOSIS — N401 Enlarged prostate with lower urinary tract symptoms: Secondary | ICD-10-CM | POA: Diagnosis not present

## 2019-01-27 DIAGNOSIS — N5201 Erectile dysfunction due to arterial insufficiency: Secondary | ICD-10-CM | POA: Diagnosis not present

## 2019-01-27 DIAGNOSIS — E291 Testicular hypofunction: Secondary | ICD-10-CM | POA: Diagnosis not present

## 2019-03-03 DIAGNOSIS — E7800 Pure hypercholesterolemia, unspecified: Secondary | ICD-10-CM | POA: Diagnosis not present

## 2019-03-17 DIAGNOSIS — E7800 Pure hypercholesterolemia, unspecified: Secondary | ICD-10-CM | POA: Diagnosis not present

## 2019-03-17 DIAGNOSIS — M79671 Pain in right foot: Secondary | ICD-10-CM | POA: Diagnosis not present

## 2019-03-17 DIAGNOSIS — Z0000 Encounter for general adult medical examination without abnormal findings: Secondary | ICD-10-CM | POA: Diagnosis not present

## 2019-04-28 DIAGNOSIS — H10233 Serous conjunctivitis, except viral, bilateral: Secondary | ICD-10-CM | POA: Diagnosis not present

## 2019-04-28 DIAGNOSIS — H2513 Age-related nuclear cataract, bilateral: Secondary | ICD-10-CM | POA: Diagnosis not present

## 2019-04-28 DIAGNOSIS — H00024 Hordeolum internum left upper eyelid: Secondary | ICD-10-CM | POA: Diagnosis not present

## 2019-04-28 DIAGNOSIS — D352 Benign neoplasm of pituitary gland: Secondary | ICD-10-CM | POA: Diagnosis not present

## 2019-08-03 DIAGNOSIS — C61 Malignant neoplasm of prostate: Secondary | ICD-10-CM | POA: Diagnosis not present

## 2019-08-03 DIAGNOSIS — E291 Testicular hypofunction: Secondary | ICD-10-CM | POA: Diagnosis not present

## 2019-08-03 DIAGNOSIS — N401 Enlarged prostate with lower urinary tract symptoms: Secondary | ICD-10-CM | POA: Diagnosis not present

## 2019-08-03 DIAGNOSIS — Z79899 Other long term (current) drug therapy: Secondary | ICD-10-CM | POA: Diagnosis not present

## 2019-08-03 DIAGNOSIS — N5201 Erectile dysfunction due to arterial insufficiency: Secondary | ICD-10-CM | POA: Diagnosis not present

## 2019-08-03 DIAGNOSIS — D400 Neoplasm of uncertain behavior of prostate: Secondary | ICD-10-CM | POA: Diagnosis not present

## 2019-09-08 DIAGNOSIS — E7800 Pure hypercholesterolemia, unspecified: Secondary | ICD-10-CM | POA: Diagnosis not present

## 2019-09-22 DIAGNOSIS — E7800 Pure hypercholesterolemia, unspecified: Secondary | ICD-10-CM | POA: Diagnosis not present

## 2019-09-22 DIAGNOSIS — Z8719 Personal history of other diseases of the digestive system: Secondary | ICD-10-CM | POA: Diagnosis not present

## 2019-10-14 ENCOUNTER — Ambulatory Visit: Payer: PPO | Admitting: Internal Medicine

## 2019-11-25 ENCOUNTER — Ambulatory Visit: Payer: PPO | Admitting: Internal Medicine

## 2019-11-25 ENCOUNTER — Other Ambulatory Visit: Payer: Self-pay

## 2019-11-25 ENCOUNTER — Encounter: Payer: Self-pay | Admitting: Internal Medicine

## 2019-11-25 VITALS — BP 134/82 | HR 69 | Ht 71.0 in | Wt 187.6 lb

## 2019-11-25 DIAGNOSIS — D497 Neoplasm of unspecified behavior of endocrine glands and other parts of nervous system: Secondary | ICD-10-CM

## 2019-11-25 DIAGNOSIS — E221 Hyperprolactinemia: Secondary | ICD-10-CM

## 2019-11-25 NOTE — Progress Notes (Addendum)
Patient ID: Zachary Hayden, male   DOB: 06-Jun-1950, 69 y.o.   MRN: [id number]  This visit occurred during the SARS-CoV-2 public health emergency.  Safety protocols were in place, including screening questions prior to the visit, additional usage of staff PPE, and extensive cleaning of exam room while observing appropriate contact time as indicated for disinfecting solutions.   HPI  Zachary Hayden is a 69 y.o.-year-old male, initially referred by his PCP, Dion Body, MD, for management of a pituitary tumor, hyperprolactinemia, and hypogonadotropic hypogonadism. Last visit 1 year ago.  Since last visit, he had COVID-19 in 12/2018.  He recovered well.  He continues on testosterone gel per urology.  He was initially on 2 pumps daily, now on 1 pump daily with good results in terms of improved erections and libido.  He only had occasional hot flashes >> resolved.  He continues to have enlarged breasts, but without discomfort.  No galactorrhea.  Reviewed and addended patient's pituitary tumor history: Pt was dx'ed with hyperprolactinemia since 2000. He was first diagnosed  On imaging in 2005 when his prolactin was 960, and pituitary MRI showed a 15 x 15  x 20 mm lesion. He has had good suppression of his prolactin with bromocriptine, but when it was tried to be tapered off after 3 years 06/2009, his prolactin levels started to rise in normal range. Otherwise it has been well suppressed . He was on bromocriptine 2.5 twice daily, no problems with fatigue,nausea, LH, or headaches. No breast discharge. Has stable mild enlargement of the breasts bilaterally without masses.  Additionally, he had hypogonadism when the prolactin was high, which initially recovered when the prolactin was suppressed, but recurred recently despite PRL being normal. He also developed prostate cancer treated with prostatectomy.  He has low libido, mild erectile dysfunction. In the past, he has noticed the significant decrease in  libido when his prolactin has risen and testosterone has declined.  He has occasional hot flashes but no muscle weakness.  He was previously on Cabergoline for approximately a year but because of price, ~4 years ago, tried Bromocriptine.  He also had a trial of bromocriptine 2016, however, his prolactin increased.  He was able to restart Cabergoline 02/2014.  Labs and imaging reports reviewed: Component     Latest Ref Rng & Units 02/19/2017  RBC     4.22 - 5.81 Mil/uL 4.67  Hemoglobin     13.0 - 17.0 g/dL 13.9  HCT     39.0 - 52.0 % 41.2  Testosterone     264 - 916 ng/dL 92 (L)  Testosterone Free     6.6 - 18.1 pg/mL 2.0 (L)  Sex Horm Binding Glob, Serum     19.3 - 76.4 nmol/L 118.1 (H)  Prolactin     2.0 - 18.0 ng/mL 5.1   Previously: 11/1998 - PRL 548.8, TSH 1.91, T4 8.6 2001- PRL 660 01/2003 - FSH<0.3, LH <0.3, PRL 939 02/2003 - MRI [facility]- enlarged pituitary gland slight to right of midline, homogenous enhancement post contrast, no stalk deviation, no impingement on optic chiasm, no narrowing of cavernous sinus, ICA; tumor 1.5 x1.5 x2cm, enhancing nodule within pituitary as well 04/2003- TSH 2.69, free T4 0.95, PRL 862, cortisol 13.4, Testosterone 298 ( 179-756), IGF-1 178 ( 90-360) 11/2003 - Testosterone low at 20- was on Androgel at that time 01/2004 - PRL 9 04/2004 - PRL 6, Testosterone 354 01/2005 - MRI: 28mm pituitary mass, decreased T1 signal , increased T2 and hypoenhancement  right aspect of sella, displacing pituitary to left 02/2005- PRL 3, Testosterone 257 02/2006-PRL 5, Testosterone 318 03/2008 PRL 3, Testosterone 278 02/2009- PRL 5, Testosterone 310>>dose reduced to bromocriptine 2.5 mg daily 05/2009 -PRL 7, Testos 271 10/2009-PRL 10 03/2010 -PRL 9>>dose increased to 5mg  daily 03/2011-PRL 3, Testos 330 01/2012 - MRI pituitary , [facility]- compared to 2010: 1.5 x 1.1 cm enhacing mass in the right sella involving right cavernous sinus and displacing pitiutary stalk to left (grossly  unchanged appearance as compared to prior) 02/2012-PRL 6, Testosterone 329 2015 - VF normal. 04/19/2014 MRI: stable prolactinoma within right side of pituitary ( 1.5 x1.2 x1.2 cm) compared to 2013 scan. There was mild left deviation of the pituitary stalk, unchanged.  03/2014: Testosterone: 205 (179-756), am, fasting 06/2015: nonfasting: t Testosterone: 174 (before the weight loss) 10/12/2015: Total testosterone 190 (264-916), SHBG 122.6 (19.3-76.4), free testosterone 2.1 (6.6-18.1)  07/2016: nonfasting: t testosterone 144; estradiol 46.2 at urologist office, PSA 0.0  He has been on Cabergoline 0.25 mg once a week since 09/2016.  He tolerates this well, without headaches, dizziness, congestion.  Reviewed previous prolactin levels: Lab Results  Component Value Date   PROLACTIN 5.8 10/14/2018   PROLACTIN 5.5 10/10/2017   PROLACTIN 5.1 02/19/2017   PROLACTIN 4.4 10/10/2016   PROLACTIN 5.9 10/12/2015   We started Clomid (with Urology consent): 25 mg daily.  He developed more hot flashes on this and no increased libido or improvement in erections.  Therefore, we stopped Clomid and I again suggested to start testosterone replacement.  This is now managed by urology per patient's preference (Dr. Yves Dill).  He is now on compounded testosterone-5 g daily ([facility] in [place]).  Latest testosterone level was reportedly normal in 07/2018.  He also has a history of HL (off statin; on Red yeast rice), GERD.  ROS: + See HPI Constitutional: no weight gain/no weight loss, no fatigue, no subjective hyperthermia, no subjective hypothermia Eyes: no blurry vision, no xerophthalmia ENT: no sore throat, no nodules palpated in neck, no dysphagia, no odynophagia, no hoarseness Cardiovascular: no CP/no SOB/no palpitations/no leg swelling Respiratory: no cough/no SOB/no wheezing Gastrointestinal: no N/no V/no D/no C/no acid reflux Musculoskeletal: no muscle aches/no joint aches Skin: no rashes,  no hair loss Neurological: no tremors/no numbness/no tingling/no dizziness  I reviewed pt's medications, allergies, PMH, social hx, family hx, and changes were documented in the history of present illness. Otherwise, unchanged from my initial visit note.  Past Medical History:  Diagnosis Date  . Allergy   . Cancer [facility]) 2005   prostate  . Colon polyp 2012   3 polyps  . GERD (gastroesophageal reflux disease)   . Hyperlipidemia   . Internal hemorrhoids   . Pituitary adenoma ([place]) 1992  . Prostate cancer ([place]) 2005   Past Surgical History:  Procedure Laterality Date  . COLONOSCOPY  2010, 2012, 2014   Dr. Dionne Milo  . COLONOSCOPY WITH PROPOFOL N/A 04/09/2017   Procedure: COLONOSCOPY WITH PROPOFOL;  Surgeon: Toledo, Benay Pike, MD;  Location: [facility] ENDOSCOPY;  Service: Gastroenterology;  Laterality: N/A;  . ESOPHAGOGASTRODUODENOSCOPY (EGD) WITH PROPOFOL N/A 04/09/2017   Procedure: ESOPHAGOGASTRODUODENOSCOPY (EGD) WITH PROPOFOL;  Surgeon: Toledo, Benay Pike, MD;  Location: [facility] ENDOSCOPY;  Service: Gastroenterology;  Laterality: N/A;  . PROSTATE SURGERY  2005   radical  . PROSTATECTOMY     Social History   Social History  . Marital Status: Married    Spouse Name: N/A  . Number of Children: 4   Occupational History  .  [employer]   Social History Main Topics  . Smoking status: Never Smoker   . Smokeless tobacco: Never Used  . Alcohol Use: 0.0 oz/week    0 Standard drinks or equivalent per week     Comment: glass of wine monthly   . Drug Use: No   Current Outpatient Medications on File Prior to Visit  Medication Sig Dispense Refill  . Arginine 1000 MG TABS Take by mouth.    Marland Kitchen aspirin EC 81 MG tablet Take 81 mg by mouth daily.    . cabergoline (DOSTINEX) 0.5 MG tablet TAKE 1/2 TABLET ONCE A WEEK 10 tablet 3  . calcium carbonate (OS-CAL) 600 MG TABS tablet Take 600 mg by mouth.    . diphenhydrAMINE (BENADRYL) 25 MG tablet Take 25 mg by mouth daily.    Marland Kitchen docusate sodium (COLACE) 100  MG capsule Take 100 mg by mouth 2 (two) times daily.    . famotidine (PEPCID) 20 MG tablet Take 20 mg by mouth 2 (two) times daily.    . flunisolide (NASALIDE) 25 MCG/ACT (0.025%) SOLN Place 1 spray into the nose 2 (two) times daily. 25 mL 5  . Omega-3 Fatty Acids (FISH OIL) 500 MG CAPS Take 1 capsule by mouth daily.    . Red Yeast Rice Extract (RED YEAST RICE PO) Take by mouth.    . sildenafil (VIAGRA) 100 MG tablet Take 100 mg by mouth as needed for erectile dysfunction.    . Testosterone 20.25 MG/ACT (1.62%) GEL APPLY 40.5MG  (2 PUMPS) ON THE SKIN AT BEDTIME 75 g 2  . therapeutic multivitamin-minerals (THERAGRAN-M) tablet Take 1 tablet by mouth daily.    . vitamin C (ASCORBIC ACID) 500 MG tablet Take 500 mg by mouth daily.     No current facility-administered medications on file prior to visit.   No Known Allergies Family History  Problem Relation Age of Onset  . Other Cousin   . Heart disease Father   . Hyperlipidemia Father   . Hypertension Father    PE: BP 134/82   Pulse 69   Ht 5\' 11"  (1.803 m)   Wt 187 lb 9.6 oz (85.1 kg)   SpO2 97%   BMI 26.16 kg/m   Body mass index is 26.16 kg/m. Wt Readings from Last 3 Encounters:  11/25/19 187 lb 9.6 oz (85.1 kg)  10/14/18 184 lb (83.5 kg)  10/10/17 181 lb 12.8 oz (82.5 kg)   Constitutional: overweight, in NAD Eyes: PERRLA, EOMI, no exophthalmos ENT: moist mucous membranes, no thyromegaly, no cervical lymphadenopathy Cardiovascular: RRR, No MRG Respiratory: CTA B Gastrointestinal: abdomen soft, NT, ND, BS+ Musculoskeletal: no deformities, strength intact in all 4 Skin: moist, warm, no rashes Neurological: no tremor with outstretched hands, DTR normal in all 4  ASSESSMENT: 1. Pituitary tumor  2.  Hyperprolactinemia  3. Hypogonadotropic hypogonadism  PLAN:  1.  Patient with history of pituitary tumor diagnosed in 2000.  This has decreased in size after starting treatment with bromocriptine and then remained stable at 1.5  cm based on the last pituitary MRI from 04/19/2014.  He is currently on a stable dose of Cabergoline. -He does not report any visual changes, headaches -His latest visual field check was in 2016 and this was normal.  I do not feel that we need to repeat this -We will repeat his MRI now, 5 years from the previous. -I will see him back in a year  2. Hyperprolactinemia - most likely 2/2 his pituitary tumor (prolactinoma) - He was  initially on bromocriptine due to price.  He tried off dopamine agonists in the past, but his prolactin started to increase - He continues on Cabergoline, which he tolerates well.  No headaches, congestion, nausea. - We discussed that with smaller pituitary tumors (microadenoma), we may entertain the idea of coming off Cabergoline, but I doubt this would be possible with a macroadenoma.  However, we will repeat his MRI now and decide after the results are back.  3. Hypogonadotropic hypogonadism - Patient has had low testosterone levels due to his high prolactin levels in the past and the levels remained low despite efficacy of the prolactinoma treatment.  He did complain of low libido and problems with erections and he was interested in starting treatment for his low testosterone level.  We discussed at last visit that we have to be very careful with this due to previous history of prostate cancer.  Of note, his urologist gave [place] the ok to use testosterone supplement. Since he also had a high estrogen level at the time at his visit with a urologist in 07/2016, I suggested to try Clomid first.  We started this in 09/2016.  He noticed more hot flashes and no perceived improvement in libido and erections - Reviewed previous testosterone levels: testosterone level was low (total testosterone 144), but he was not fasting at the time of his last visit with his urologist (he usually h sees him in June of every year); we repeated his labs on 10/10/2016, fasting, and the testosterone was  still low (total testosterone 158, free testosterone 2.3).  Most recent estrogen level has been normal (10/10/2016: Free estradiol level 11, normal <29). Reportedly, his most recent testosterone from 07/2019 was reportedly 273 - normal. -I suggested testosterone treatment and he was cleared for this by his urologist (Dr. Eliberto Ivory).  He started the patient on compounded testosterone cream (cheaper for him) he does great on this. -He will continue to follow with his urologist for this problem  Needs refills.  Component     Latest Ref Rng & Units 11/25/2019  Prolactin     2.0 - 18.0 ng/mL 5.4  Normal prolactin level we will continue the same Cabergoline dose for now.  CLINICAL DATA:  Brain/CNS neoplasm.  Surveillance.  EXAM: MRI HEAD WITHOUT AND WITH CONTRAST  TECHNIQUE: Multiplanar, multiecho pulse sequences of the brain and surrounding structures were obtained without and with intravenous contrast.  CONTRAST:  76mL MULTIHANCE GADOBENATE DIMEGLUMINE 529 MG/ML IV SOLN  COMPARISON:  None.  FINDINGS: Brain: No acute infarction, hemorrhage, hydrocephalus or extra-axial collection.  A few scattered foci of T2 hyperintensity are seen within the white matter of the cerebral hemispheres, nonspecific. Mild parenchymal volume loss.  Pituitary/Sella: A T1 hypointense, nonenhancing lesion is seen within the right side of the sella and extending into the right cavernous sinus, measuring approximately 11 x 8 x 7 mm. No extension into the suprasellar cistern. Normal pituitary gland is mildly deviated to the left. The pituitary stalk has normal thickness and is mildly deviated to the left. The hypothalamus and mamillary bodies are normal. There is no mass effect on the optic chiasm or optic nerves. The infundibular and chiasmatic recesses are clear. Normal cavernous internal carotid artery flow voids.  Vascular: Normal flow voids.  Skull and upper cervical spine: Normal marrow  signal.  Sinuses/Orbits: Mucosal thickening throughout the paranasal sinuses, more pronounced in the ethmoid cells. The orbits are maintained.  Other: None.  IMPRESSION: 1. A 11 x 8 x 7 mm  lesion within the right side of the sella and extending into the right cavernous sinus, consistent with a pituitary adenoma. No extension into the suprasellar cistern. 2. A few scattered foci of T2 hyperintensity within the white matter of the cerebral hemispheres, nonspecific. This may represent microvascular ischemic changes. 3. Paranasal sinus disease, more pronounced in the ethmoid cells.   Electronically Signed   By: Pedro Earls M.D.   On: 12/21/2019 11:14  Significant improvement in size of his pituitary adenoma.  We will continue the current dose of Cabergoline.  Philemon Kingdom, MD PhD [facility] Endocrinology

## 2019-11-25 NOTE — Patient Instructions (Signed)
Please stop at the lab.  Continue Cabergoline 0.25 mg weekly.  Please come back for a follow-up appointment in 1 year. 

## 2019-11-26 ENCOUNTER — Encounter: Payer: Self-pay | Admitting: Internal Medicine

## 2019-11-26 LAB — PROLACTIN: Prolactin: 5.4 ng/mL (ref 2.0–18.0)

## 2019-11-26 MED ORDER — CABERGOLINE 0.5 MG PO TABS: ORAL_TABLET | ORAL | 3 refills | Status: DC

## 2019-12-09 ENCOUNTER — Encounter: Payer: Self-pay | Admitting: Dermatology

## 2019-12-09 ENCOUNTER — Other Ambulatory Visit: Payer: Self-pay

## 2019-12-09 ENCOUNTER — Ambulatory Visit: Payer: PPO | Admitting: Dermatology

## 2019-12-09 DIAGNOSIS — Z85828 Personal history of other malignant neoplasm of skin: Secondary | ICD-10-CM | POA: Diagnosis not present

## 2019-12-09 DIAGNOSIS — L578 Other skin changes due to chronic exposure to nonionizing radiation: Secondary | ICD-10-CM | POA: Diagnosis not present

## 2019-12-09 DIAGNOSIS — L219 Seborrheic dermatitis, unspecified: Secondary | ICD-10-CM | POA: Diagnosis not present

## 2019-12-09 DIAGNOSIS — L853 Xerosis cutis: Secondary | ICD-10-CM

## 2019-12-09 DIAGNOSIS — L814 Other melanin hyperpigmentation: Secondary | ICD-10-CM

## 2019-12-09 DIAGNOSIS — Z1283 Encounter for screening for malignant neoplasm of skin: Secondary | ICD-10-CM

## 2019-12-09 DIAGNOSIS — D229 Melanocytic nevi, unspecified: Secondary | ICD-10-CM

## 2019-12-09 DIAGNOSIS — L821 Other seborrheic keratosis: Secondary | ICD-10-CM

## 2019-12-09 DIAGNOSIS — D1800 Hemangioma unspecified site: Secondary | ICD-10-CM | POA: Diagnosis not present

## 2019-12-09 NOTE — Patient Instructions (Addendum)
Recommend Cerave cream daily to moisturize   Recommend 1% hydrocortisone 4 times per week as needed for flares of ears

## 2019-12-09 NOTE — Progress Notes (Signed)
   Follow-Up Visit   Subjective  Zachary Hayden is a 69 y.o. male who presents for the following: Annual Exam (History of BCC - TBSE today) and Other (Spot of scalp that is scaly). The patient presents for Total-Body Skin Exam (TBSE) for skin cancer screening and mole check.  The following portions of the chart were reviewed this encounter and updated as appropriate:  Tobacco  Allergies  Meds  Problems  Med Hx  Surg Hx  Fam Hx     Review of Systems:  No other skin or systemic complaints except as noted in HPI or Assessment and Plan.  Objective  Well appearing patient in no apparent distress; mood and affect are within normal limits.  A full examination was performed including scalp, head, eyes, ears, nose, lips, neck, chest, axillae, abdomen, back, buttocks, bilateral upper extremities, bilateral lower extremities, hands, feet, fingers, toes, fingernails, and toenails. All findings within normal limits unless otherwise noted below.  Objective  Right top of shoulder: Well healed scar with no evidence of recurrence.   Objective  Left Ear: Clear today  Objective  Legs: Xerosis   Assessment & Plan     Lentigines - Scattered tan macules - Discussed due to sun exposure - Benign, observe - Call for any changes  Seborrheic Keratoses - Stuck-on, waxy, tan-brown papules and plaques  - Discussed benign etiology and prognosis. - Observe - Call for any changes  Melanocytic Nevi - Tan-brown and/or pink-flesh-colored symmetric macules and papules - Benign appearing on exam today - Observation - Call clinic for new or changing moles - Recommend daily use of broad spectrum spf 30+ sunscreen to sun-exposed areas.   Hemangiomas - Red papules - Discussed benign nature - Observe - Call for any changes  Actinic Damage - diffuse scaly erythematous macules with underlying dyspigmentation - Recommend daily broad spectrum sunscreen SPF 30+ to sun-exposed areas, reapply every  2 hours as needed.  - Call for new or changing lesions.  Skin cancer screening performed today.   History of basal cell carcinoma (BCC) Right top of shoulder  Clear. Observe for recurrence. Call clinic for new or changing lesions.  Recommend regular skin exams, daily broad-spectrum spf 30+ sunscreen use, and photoprotection.     Seborrheic dermatitis Left Ear  Probable seborrheic dermatitis but clear today.  Recommend 1% hydrocortisone 4 times per week as needed for flares.  Xerosis cutis Legs  Recommend Cerave cream daily to moisturize  Skin cancer screening  Return in about 1 year (around 12/08/2020) for TBSE.   I, Ashok Cordia, CMA, am acting as scribe for Sarina Ser, MD .  Documentation: I have reviewed the above documentation for accuracy and completeness, and I agree with the above.  Sarina Ser, MD

## 2019-12-21 ENCOUNTER — Other Ambulatory Visit: Payer: Self-pay

## 2019-12-21 ENCOUNTER — Ambulatory Visit
Admission: RE | Admit: 2019-12-21 | Discharge: 2019-12-21 | Disposition: A | Discharge status: Home / Self Care | Payer: PPO | Source: Ambulatory Visit | Attending: Internal Medicine | Admitting: Internal Medicine

## 2019-12-21 DIAGNOSIS — C719 Malignant neoplasm of brain, unspecified: Secondary | ICD-10-CM | POA: Diagnosis not present

## 2019-12-21 DIAGNOSIS — G9389 Other specified disorders of brain: Secondary | ICD-10-CM | POA: Diagnosis not present

## 2019-12-21 DIAGNOSIS — J322 Chronic ethmoidal sinusitis: Secondary | ICD-10-CM | POA: Diagnosis not present

## 2019-12-21 DIAGNOSIS — D497 Neoplasm of unspecified behavior of endocrine glands and other parts of nervous system: Secondary | ICD-10-CM

## 2019-12-21 DIAGNOSIS — J3489 Other specified disorders of nose and nasal sinuses: Secondary | ICD-10-CM | POA: Diagnosis not present

## 2019-12-21 MED ORDER — GADOBENATE DIMEGLUMINE 529 MG/ML IV SOLN
10.0000 mL | Freq: Once | INTRAVENOUS | Status: AC | PRN
  Administered 2019-12-21: 10 mL via INTRAVENOUS

## 2020-02-08 DIAGNOSIS — N401 Enlarged prostate with lower urinary tract symptoms: Secondary | ICD-10-CM | POA: Diagnosis not present

## 2020-02-08 DIAGNOSIS — E291 Testicular hypofunction: Secondary | ICD-10-CM | POA: Diagnosis not present

## 2020-02-08 DIAGNOSIS — C61 Malignant neoplasm of prostate: Secondary | ICD-10-CM | POA: Diagnosis not present

## 2020-02-08 DIAGNOSIS — Z79899 Other long term (current) drug therapy: Secondary | ICD-10-CM | POA: Diagnosis not present

## 2020-02-08 DIAGNOSIS — D400 Neoplasm of uncertain behavior of prostate: Secondary | ICD-10-CM | POA: Diagnosis not present

## 2020-03-22 DIAGNOSIS — E7849 Other hyperlipidemia: Secondary | ICD-10-CM | POA: Diagnosis not present

## 2020-06-29 DIAGNOSIS — H524 Presbyopia: Secondary | ICD-10-CM | POA: Diagnosis not present

## 2020-06-29 DIAGNOSIS — D352 Benign neoplasm of pituitary gland: Secondary | ICD-10-CM | POA: Diagnosis not present

## 2020-06-29 DIAGNOSIS — H5213 Myopia, bilateral: Secondary | ICD-10-CM | POA: Diagnosis not present

## 2020-06-29 DIAGNOSIS — H43813 Vitreous degeneration, bilateral: Secondary | ICD-10-CM | POA: Diagnosis not present

## 2020-06-29 DIAGNOSIS — H2513 Age-related nuclear cataract, bilateral: Secondary | ICD-10-CM | POA: Diagnosis not present

## 2020-06-29 DIAGNOSIS — H00026 Hordeolum internum left eye, unspecified eyelid: Secondary | ICD-10-CM | POA: Diagnosis not present

## 2020-06-29 DIAGNOSIS — H52223 Regular astigmatism, bilateral: Secondary | ICD-10-CM | POA: Diagnosis not present

## 2020-08-08 DIAGNOSIS — Z79899 Other long term (current) drug therapy: Secondary | ICD-10-CM | POA: Diagnosis not present

## 2020-08-08 DIAGNOSIS — E291 Testicular hypofunction: Secondary | ICD-10-CM | POA: Diagnosis not present

## 2020-08-08 DIAGNOSIS — C61 Malignant neoplasm of prostate: Secondary | ICD-10-CM | POA: Diagnosis not present

## 2020-08-08 DIAGNOSIS — N401 Enlarged prostate with lower urinary tract symptoms: Secondary | ICD-10-CM | POA: Diagnosis not present

## 2020-09-21 DIAGNOSIS — E7849 Other hyperlipidemia: Secondary | ICD-10-CM | POA: Diagnosis not present

## 2020-11-24 ENCOUNTER — Ambulatory Visit (INDEPENDENT_AMBULATORY_CARE_PROVIDER_SITE_OTHER): Payer: PPO | Admitting: Internal Medicine

## 2020-11-24 ENCOUNTER — Ambulatory Visit: Payer: PPO | Admitting: Internal Medicine

## 2020-11-24 ENCOUNTER — Other Ambulatory Visit: Payer: Self-pay

## 2020-11-24 ENCOUNTER — Encounter: Payer: Self-pay | Admitting: Internal Medicine

## 2020-11-24 VITALS — BP 136/78 | HR 70 | Ht 71.0 in | Wt 184.8 lb

## 2020-11-24 DIAGNOSIS — E221 Hyperprolactinemia: Secondary | ICD-10-CM | POA: Diagnosis not present

## 2020-11-24 DIAGNOSIS — D497 Neoplasm of unspecified behavior of endocrine glands and other parts of nervous system: Secondary | ICD-10-CM

## 2020-11-24 NOTE — Progress Notes (Signed)
Patient ID: Zachary Hayden, male   DOB: 04-02-1950, 70 y.o.   MRN: [id number]  This visit occurred during the SARS-CoV-2 public health emergency.  Safety protocols were in place, including screening questions prior to the visit, additional usage of staff PPE, and extensive cleaning of exam room while observing appropriate contact time as indicated for disinfecting solutions.   HPI  Zachary Hayden is a 70 y.o.-year-old male, initially referred by his PCP, Dion Body, MD, for management of a pituitary tumor, hyperprolactinemia, and hypogonadotropic hypogonadism. Last visit 1 year ago.  Interim history: He has been doing well since last visit, without complaints. No headaches, no breast tenderness or galactorrhea.  He does have a large breasts, which are not new.  He had occasional hot flashes in the past, resolved. He continues on testosterone gel per urology.  He was initially on 2 pumps daily, then on 1 pump daily with good results in terms of improved erections and libido. Latest level in 07/2020 - reportedly normal.   Reviewed and addended patient's pituitary tumor history: Pt was dx'ed with hyperprolactinemia since 2000. He was first diagnosed  On imaging in 2005 when his prolactin was 960, and pituitary MRI showed a 15 x 15  x 20 mm lesion. He has had good suppression of his prolactin with bromocriptine, but when it was tried to be tapered off after 3 years 06/2009, his prolactin levels started to rise in normal range. Otherwise it has been well suppressed . He was on bromocriptine 2.5 twice daily, no problems with fatigue,nausea, LH, or headaches. No breast discharge. Has stable mild enlargement of the breasts bilaterally without masses.  Additionally, he had hypogonadism when the prolactin was high, which initially recovered when the prolactin was suppressed, but recurred despite PRL being normal. He also developed prostate cancer treated with prostatectomy.  He had low libido, mild  erectile dysfunction. In the past, he has noticed the significant decrease in libido when his prolactin has risen and testosterone has declined.  He had occasional hot flashes but no muscle weakness.  He was previously on Cabergoline for approximately a year but because of price, ~4 years ago, tried Bromocriptine.  He also had a trial of bromocriptine 2016, however, his prolactin increased.  He was able to restart Cabergoline 02/2014.  He continues on Cabergoline 0.25 mg weekly since 09/2016.  He tolerates this well, without dizziness, headaches, nausea, congestion.  Labs and imaging reports reviewed: Since last visit, we checked another pituitary MRI (12/21/2019): His pituitary adenoma decreased in size: Brain: No acute infarction, hemorrhage, hydrocephalus or extra-axial collection.   A few scattered foci of T2 hyperintensity are seen within the white matter of the cerebral hemispheres, nonspecific. Mild parenchymal volume loss.   Pituitary/Sella: A T1 hypointense, nonenhancing lesion is seen within the right side of the sella and extending into the right cavernous sinus, measuring approximately 11 x 8 x 7 mm. No extension into the suprasellar cistern. Normal pituitary gland is mildly deviated to the left. The pituitary stalk has normal thickness and is mildly deviated to the left. The hypothalamus and mamillary bodies are normal. There is no mass effect on the optic chiasm or optic nerves. The infundibular and chiasmatic recesses are clear. Normal cavernous internal carotid artery flow voids.   Vascular: Normal flow voids.   Skull and upper cervical spine: Normal marrow signal.   Sinuses/Orbits: Mucosal thickening throughout the paranasal sinuses, more pronounced in the ethmoid cells. The orbits are maintained.   Other:  None.   IMPRESSION: 1. A 11 x 8 x 7 mm lesion within the right side of the sella and extending into the right cavernous sinus, consistent with a pituitary  adenoma. No extension into the suprasellar cistern. 2. A few scattered foci of T2 hyperintensity within the white matter of the cerebral hemispheres, nonspecific. This may represent microvascular ischemic changes. 3. Paranasal sinus disease, more pronounced in the ethmoid cells.  Reviewed prolactin levels: Lab Results  Component Value Date   PROLACTIN 5.4 11/25/2019   PROLACTIN 5.8 10/14/2018   PROLACTIN 5.5 10/10/2017   PROLACTIN 5.1 02/19/2017   PROLACTIN 4.4 10/10/2016   PROLACTIN 5.9 10/12/2015   PROLACTIN 4.3 12/27/2014   PROLACTIN 2.4 10/11/2014   PROLACTIN 3.6 07/07/2014   Reviewed previous labs: Component     Latest Ref Rng & Units 02/19/2017  RBC     4.22 - 5.81 Mil/uL 4.67  Hemoglobin     13.0 - 17.0 g/dL 13.9  HCT     39.0 - 52.0 % 41.2  Testosterone     264 - 916 ng/dL 92 (L)  Testosterone Free     6.6 - 18.1 pg/mL 2.0 (L)  Sex Horm Binding Glob, Serum     19.3 - 76.4 nmol/L 118.1 (H)  Previously: 11/1998 - PRL 548.8, TSH 1.91, T4 8.6 2001- PRL 660 01/2003 - FSH<0.3, LH <0.3, PRL 939 02/2003 - MRI [facility]- enlarged pituitary gland slight to right of midline, homogenous enhancement post contrast, no stalk deviation, no impingement on optic chiasm, no narrowing of cavernous sinus, ICA; tumor 1.5 x1.5 x2cm, enhancing nodule within pituitary as well 04/2003- TSH 2.69, free T4 0.95, PRL 862, cortisol 13.4, Testosterone 298 ( 179-756), IGF-1 178 ( 90-360) 11/2003 - Testosterone low at 20- was on Androgel at that time 01/2004 - PRL 9 04/2004 - PRL 6, Testosterone 354 01/2005 - MRI: 42mm pituitary mass, decreased T1 signal , increased T2 and hypoenhancement right aspect of sella, displacing pituitary to left 02/2005- PRL 3, Testosterone 257 02/2006-PRL 5, Testosterone 318 03/2008 PRL 3, Testosterone 278 02/2009- PRL 5, Testosterone 310>>dose reduced to bromocriptine 2.5 mg daily 05/2009 -PRL 7, Testos 271 10/2009-PRL 10 03/2010 -PRL 9>>dose increased to 5mg  daily 03/2011-PRL  3, Testos 330 01/2012 - MRI pituitary , [facility]- compared to 2010: 1.5 x 1.1 cm enhacing mass in the right sella involving right cavernous sinus and displacing pitiutary stalk to left (grossly unchanged appearance as compared to prior) 02/2012-PRL 6, Testosterone 329 2015 - VF normal. 04/19/2014 MRI: stable prolactinoma within right side of pituitary ( 1.5 x1.2 x1.2 cm) compared to 2013 scan. There was mild left deviation of the pituitary stalk, unchanged.  03/2014: Testosterone: 205 (179-756), am, fasting 06/2015: nonfasting: t Testosterone: 174 (before the weight loss) 10/12/2015: Total testosterone 190 (264-916), SHBG 122.6 (19.3-76.4), free testosterone 2.1 (6.6-18.1)  07/2016: nonfasting: t testosterone 144; estradiol 46.2 at urologist office, PSA 0.0 07/2019: Testosterone reportedly 273 07/2020: Testosterone reportedly 299  We initially started Clomid (with Urology consent): 25 mg daily.  He developed more hot flashes on this and no increased libido or improvement in erections.  Therefore, we stopped Clomid and I again suggested to start testosterone replacement.  This is now managed by urology per patient's preference (Dr. Yves Dill).  He is now on compounded testosterone-5 g daily ([facility] in [place]).   He also has a history of HL (off statin; on Red yeast rice), GERD.  ROS: + See HPI  I reviewed pt's medications, allergies, PMH, social  hx, family hx, and changes were documented in the history of present illness. Otherwise, unchanged from my initial visit note.  Past Medical History:  Diagnosis Date   Allergy    Basal cell carcinoma 08/27/2018   Right top of shoulder. Superficial and nodular patterns.   Cancer [facility]) 2005   prostate   Colon polyp 2012   3 polyps   GERD (gastroesophageal reflux disease)    Hyperlipidemia    Internal hemorrhoids    Pituitary adenoma ([place]) 1992   Prostate cancer ([place]) 2005   Past Surgical History:  Procedure Laterality Date    COLONOSCOPY  2010, 2012, 2014   Dr. Dionne Milo   COLONOSCOPY WITH PROPOFOL N/A 04/09/2017   Procedure: COLONOSCOPY WITH PROPOFOL;  Surgeon: Toledo, Benay Pike, MD;  Location: [facility] ENDOSCOPY;  Service: Gastroenterology;  Laterality: N/A;   ESOPHAGOGASTRODUODENOSCOPY (EGD) WITH PROPOFOL N/A 04/09/2017   Procedure: ESOPHAGOGASTRODUODENOSCOPY (EGD) WITH PROPOFOL;  Surgeon: Toledo, Benay Pike, MD;  Location: [facility] ENDOSCOPY;  Service: Gastroenterology;  Laterality: N/A;   PROSTATE SURGERY  2005   radical   PROSTATECTOMY     Social History   Social History   Marital Status: Married    Spouse Name: N/A   Number of Children: 4   Occupational History    Pastor   Social History Main Topics   Smoking status: Never Smoker    Smokeless tobacco: Never Used   Alcohol Use: 0.0 oz/week    0 Standard drinks or equivalent per week     Comment: glass of wine monthly    Drug Use: No   Current Outpatient Medications on File Prior to Visit  Medication Sig Dispense Refill   Arginine 1000 MG TABS Take by mouth.     aspirin EC 81 MG tablet Take 81 mg by mouth daily.     cabergoline (DOSTINEX) 0.5 MG tablet TAKE 1/2 TABLET ONCE A WEEK 10 tablet 3   calcium carbonate (OS-CAL) 600 MG TABS tablet Take 600 mg by mouth.     docusate sodium (COLACE) 100 MG capsule Take 100 mg by mouth 2 (two) times daily.     famotidine (PEPCID) 20 MG tablet Take 20 mg by mouth every other day.      Lysine HCl 1000 MG TABS      Omega-3 Fatty Acids (FISH OIL) 500 MG CAPS Take 1 capsule by mouth daily.     omeprazole (PRILOSEC OTC) 20 MG tablet Take 20 mg by mouth every other day.      pravastatin (PRAVACHOL) 20 MG tablet Take 20 mg by mouth at bedtime.     QUERCETIN PO      sildenafil (VIAGRA) 100 MG tablet Take 100 mg by mouth as needed for erectile dysfunction.     Testosterone 20.25 MG/ACT (1.62%) GEL APPLY 40.5MG  (2 PUMPS) ON THE SKIN AT BEDTIME 75 g 2   therapeutic multivitamin-minerals (THERAGRAN-M) tablet Take 1 tablet by  mouth daily.     vitamin C (ASCORBIC ACID) 500 MG tablet Take 500 mg by mouth daily.     Zinc 100 MG TABS      No current facility-administered medications on file prior to visit.   No Known Allergies Family History  Problem Relation Age of Onset   Other Cousin    Heart disease Father    Hyperlipidemia Father    Hypertension Father    PE: There were no vitals taken for this visit.  There is no height or weight on file to calculate BMI. Wt Readings from  Last 3 Encounters:  11/25/19 187 lb 9.6 oz (85.1 kg)  10/14/18 184 lb (83.5 kg)  10/10/17 181 lb 12.8 oz (82.5 kg)   Constitutional: overweight, in NAD Eyes: PERRLA, EOMI, no exophthalmos ENT: moist mucous membranes, no thyromegaly, no cervical lymphadenopathy Cardiovascular: RRR, No MRG Respiratory: CTA B Gastrointestinal: abdomen soft, NT, ND, BS+ Musculoskeletal: no deformities, strength intact in all 4 Skin: moist, warm, no rashes Neurological: no tremor with outstretched hands, DTR normal in all 4  ASSESSMENT: 1. Pituitary tumor  2.  Hyperprolactinemia  3. Hypogonadotropic hypogonadism  PLAN:  1.  Patient with history of pituitary tumor diagnosed in 2000.  This has decreased in size after starting treatment with bromocriptine and then remained stable at 1.5 cm based on his pituitary MRI from 04/19/2014.  Afterwards, we started Cabergoline.  On the latest MRI obtained after last visit, the size of the tumor decreased to a maximum of 1.1 cm.  We discussed that this is a good result. -Since he has a microadenoma which does not impinge on the optic chiasm, I do not feel that we need to repeat visual field checks.  Latest was from 2016 and this was normal. -He denies headaches, visual disturbance -We will see him back in a year  2. Hyperprolactinemia -Most likely related to his pituitary tumor (prolactinoma) -He was initially on bromocriptine due to price.  He tried off dopamine agonist in the past but he is prolactin  increased -He now continues on Cabergoline, which he tolerates well, without side effects -Latest prolactin level was excellent, at 5.4.  We continued the same dose of Cabergoline, 0.25 mg weekly. -We discussed that with smaller pituitary tumors we may consider taking him off Cabergoline for a drug holiday, but usually, this is not recommended for tumors larger than 10 mm. -At today's visit, we will check his prolactin level and if lower than before, we may be able to reduce the dose of Cabergoline to 0.25 mg every other week  3. Hypogonadotropic hypogonadism -Now managed by urology - Patient has had low testosterone levels due to his high prolactin levels in the past and the levels remained low despite efficacy of the prolactinoma treatment.  He did complain of low libido and problems with erections and he was interested in starting treatment for his low testosterone level.  We discussed  that we have to be very careful with this due to previous history of prostate cancer.  Of note, his urologist gave [place] the ok to use testosterone supplement. Since he also had a high estrogen level at the time at his visit with a urologist in 07/2016, I suggested to try Clomid first.  We started this in 09/2016.  He noticed more hot flashes but no perceived improvement in libido and erections - Reviewed previous testosterone levels: testosterone level was low (total testosterone 144), but he was not fasting at the time of his last visit with his urologist (he usually sees him in June of every year); we repeated his labs on 10/10/2016, fasting, and the testosterone was still low (total testosterone 158, free testosterone 2.3).  Estrogen level has been normal (10/10/2016: Free estradiol level 11, normal <29).  Reportedly his most recent testosterone from 07/2020 was normal at 299. -I suggested testosterone treatment and he was cleared for this by his urologist (Dr. Eliberto Ivory).  He started the patient on compounded testosterone  cream (cheaper for him) he does great on this.  Needs refills.  Component     Latest Ref  Rng & Units 11/24/2020  Prolactin     2.0 - 18.0 ng/mL 6.6  Normal prolactin.  We can continue the same dose of Cabergoline, 0.25 mg weekly.  Addendum: Patient sent me a message after the visit that his multivitamins contain 50 mcg of biotin.  This is a very low dose, unlikely to influence his lab results.  Philemon Kingdom, MD PhD [facility] Endocrinology

## 2020-11-24 NOTE — Patient Instructions (Signed)
Please stop at the lab.  Continue Cabergoline 0.25 mg weekly.  Please come back for a follow-up appointment in 1 year. 

## 2020-11-25 ENCOUNTER — Encounter: Payer: Self-pay | Admitting: Internal Medicine

## 2020-11-25 LAB — PROLACTIN: Prolactin: 6.6 ng/mL (ref 2.0–18.0)

## 2020-11-27 ENCOUNTER — Encounter: Payer: Self-pay | Admitting: Internal Medicine

## 2020-11-27 MED ORDER — CABERGOLINE 0.5 MG PO TABS: ORAL_TABLET | ORAL | 3 refills | Status: DC

## 2020-12-13 ENCOUNTER — Other Ambulatory Visit: Payer: Self-pay

## 2020-12-13 ENCOUNTER — Ambulatory Visit: Payer: PPO | Admitting: Dermatology

## 2020-12-13 ENCOUNTER — Encounter: Payer: Self-pay | Admitting: Dermatology

## 2020-12-13 DIAGNOSIS — Z1283 Encounter for screening for malignant neoplasm of skin: Secondary | ICD-10-CM

## 2020-12-13 DIAGNOSIS — D229 Melanocytic nevi, unspecified: Secondary | ICD-10-CM

## 2020-12-13 DIAGNOSIS — L820 Inflamed seborrheic keratosis: Secondary | ICD-10-CM | POA: Diagnosis not present

## 2020-12-13 DIAGNOSIS — Z85828 Personal history of other malignant neoplasm of skin: Secondary | ICD-10-CM

## 2020-12-13 DIAGNOSIS — D485 Neoplasm of uncertain behavior of skin: Secondary | ICD-10-CM

## 2020-12-13 DIAGNOSIS — L821 Other seborrheic keratosis: Secondary | ICD-10-CM | POA: Diagnosis not present

## 2020-12-13 DIAGNOSIS — L578 Other skin changes due to chronic exposure to nonionizing radiation: Secondary | ICD-10-CM | POA: Diagnosis not present

## 2020-12-13 DIAGNOSIS — L814 Other melanin hyperpigmentation: Secondary | ICD-10-CM

## 2020-12-13 DIAGNOSIS — D1800 Hemangioma unspecified site: Secondary | ICD-10-CM | POA: Diagnosis not present

## 2020-12-13 NOTE — Progress Notes (Signed)
Follow-Up Visit   Subjective  Zachary Hayden is a 70 y.o. male who presents for the following: Annual Exam (History of BCC - TBSE today). The patient presents for Total-Body Skin Exam (TBSE) for skin cancer screening and mole check.  The following portions of the chart were reviewed this encounter and updated as appropriate:   Tobacco  Allergies  Meds  Problems  Med Hx  Surg Hx  Fam Hx     Review of Systems:  No other skin or systemic complaints except as noted in HPI or Assessment and Plan.  Objective  Well appearing patient in no apparent distress; mood and affect are within normal limits.  A full examination was performed including scalp, head, eyes, ears, nose, lips, neck, chest, axillae, abdomen, back, buttocks, bilateral upper extremities, bilateral lower extremities, hands, feet, fingers, toes, fingernails, and toenails. All findings within normal limits unless otherwise noted below.  Right sup chest parasternal 0.6 cm hyperkeratotic papule        Assessment & Plan   History of Basal Cell Carcinoma of the Skin - No evidence of recurrence today - Recommend regular full body skin exams - Recommend daily broad spectrum sunscreen SPF 30+ to sun-exposed areas, reapply every 2 hours as needed.  - Call if any new or changing lesions are noted between office visits  Lentigines - Scattered tan macules - Due to sun exposure - Benign-appearing, observe - Recommend daily broad spectrum sunscreen SPF 30+ to sun-exposed areas, reapply every 2 hours as needed. - Call for any changes  Seborrheic Keratoses - Stuck-on, waxy, tan-brown papules and/or plaques  - Benign-appearing - Discussed benign etiology and prognosis. - Observe - Call for any changes  Melanocytic Nevi - Tan-brown and/or pink-flesh-colored symmetric macules and papules - Benign appearing on exam today - Observation - Call clinic for new or changing moles - Recommend daily use of broad spectrum spf  30+ sunscreen to sun-exposed areas.   Hemangiomas - Red papules - Discussed benign nature - Observe - Call for any changes  Actinic Damage - Chronic condition, secondary to cumulative UV/sun exposure - diffuse scaly erythematous macules with underlying dyspigmentation - Recommend daily broad spectrum sunscreen SPF 30+ to sun-exposed areas, reapply every 2 hours as needed.  - Staying in the shade or wearing long sleeves, sun glasses (UVA+UVB protection) and wide brim hats (4-inch brim around the entire circumference of the hat) are also recommended for sun protection.  - Call for new or changing lesions.  Skin cancer screening performed today.  Neoplasm of uncertain behavior of skin Right sup chest parasternal  Epidermal / dermal shaving  Lesion diameter (cm):  0.6 Informed consent: discussed and consent obtained   Timeout: patient name, date of birth, surgical site, and procedure verified   Procedure prep:  Patient was prepped and draped in usual sterile fashion Prep type:  Isopropyl alcohol Anesthesia: the lesion was anesthetized in a standard fashion   Anesthetic:  1% lidocaine w/ epinephrine 1-100,000 buffered w/ 8.4% NaHCO3 Instrument used: flexible razor blade   Hemostasis achieved with: pressure, aluminum chloride and electrodesiccation   Outcome: patient tolerated procedure well   Post-procedure details: sterile dressing applied and wound care instructions given   Dressing type: bandage and petrolatum    Specimen 1 - Surgical pathology Differential Diagnosis: SK vs SCC vs other  Check Margins: No  Skin cancer screening  Return in about 1 year (around 12/13/2021) for TBSE.  I, Ashok Cordia, CMA, am acting as scribe for  Sarina Ser, MD . Documentation: I have reviewed the above documentation for accuracy and completeness, and I agree with the above.  Sarina Ser, MD

## 2020-12-13 NOTE — Patient Instructions (Addendum)
Wound Care Instructions  Cleanse wound gently with soap and water once a day then pat dry with clean gauze. Apply a thing coat of Petrolatum (petroleum jelly, "Vaseline") over the wound (unless you have an allergy to this). We recommend that you use a new, sterile tube of Vaseline. Do not pick or remove scabs. Do not remove the yellow or white "healing tissue" from the base of the wound.  Cover the wound with fresh, clean, nonstick gauze and secure with paper tape. You may use Band-Aids in place of gauze and tape if the would is small enough, but would recommend trimming much of the tape off as there is often too much. Sometimes Band-Aids can irritate the skin.  You should call the office for your biopsy report after 1 week if you have not already been contacted.  If you experience any problems, such as abnormal amounts of bleeding, swelling, significant bruising, significant pain, or evidence of infection, please call the office immediately.  FOR ADULT SURGERY PATIENTS: If you need something for pain relief you may take 1 extra strength Tylenol (acetaminophen) AND 2 Ibuprofen (200mg each) together every 4 hours as needed for pain. (do not take these if you are allergic to them or if you have a reason you should not take them.) Typically, you may only need pain medication for 1 to 3 days.   If you have any questions or concerns for your doctor, please call our main line at [phone] and press option 4 to reach your doctor's medical assistant. If no one answers, please leave a voicemail as directed and we will return your call as soon as possible. Messages left after 4 pm will be answered the following business day.   You may also send us a message via MyChart. We typically respond to MyChart messages within 1-2 business days.  For prescription refills, please ask your pharmacy to contact our office. Our fax number is [phone].  If you have an urgent issue when the clinic is closed that  cannot wait until the next business day, you can page your doctor at the number below.    Please note that while we do our best to be available for urgent issues outside of office hours, we are not available 24/7.   If you have an urgent issue and are unable to reach us, you may choose to seek medical care at your doctor's office, retail clinic, urgent care center, or emergency room.  If you have a medical emergency, please immediately call 911 or go to the emergency department.  Pager Numbers  - Dr. Kowalski: [phone]  - Dr. Moye: [phone]  - Dr. Stewart: [phone]  In the event of inclement weather, please call our main line at [phone] for an update on the status of any delays or closures.  Dermatology Medication Tips: Please keep the boxes that topical medications come in in order to help keep track of the instructions about where and how to use these. Pharmacies typically print the medication instructions only on the boxes and not directly on the medication tubes.   If your medication is too expensive, please contact our office at [phone] option 4 or send us a message through MyChart.   We are unable to tell what your co-pay for medications will be in advance as this is different depending on your insurance coverage. However, we may be able to find a substitute medication at lower cost or fill out paperwork to get insurance to cover a needed   medication.   If a prior authorization is required to get your medication covered by your insurance company, please allow us 1-2 business days to complete this process.  Drug prices often vary depending on where the prescription is filled and some pharmacies may offer cheaper prices.  The website www.goodrx.com contains coupons for medications through different pharmacies. The prices here do not account for what the cost may be with help from insurance (it may be cheaper with your insurance), but the website can give you the  price if you did not use any insurance.  - You can print the associated coupon and take it with your prescription to the pharmacy.  - You may also stop by our office during regular business hours and pick up a GoodRx coupon card.  - If you need your prescription sent electronically to a different pharmacy, notify our office through [place] MyChart or by phone at [phone] option 4.   

## 2020-12-26 ENCOUNTER — Telehealth: Payer: Self-pay

## 2020-12-26 NOTE — Telephone Encounter (Signed)
-----   Message from Ralene Bathe, MD sent at 12/25/2020  5:54 PM EST ----- Diagnosis Skin , right sup chest parasternal SEBORRHEIC KERATOSIS, INFLAMED  Benign inflamed keratosis No further treatment needed

## 2020-12-26 NOTE — Telephone Encounter (Signed)
Called pt discussed biopsy results  

## 2021-01-11 DIAGNOSIS — G5761 Lesion of plantar nerve, right lower limb: Secondary | ICD-10-CM | POA: Diagnosis not present

## 2021-02-06 DIAGNOSIS — Z79899 Other long term (current) drug therapy: Secondary | ICD-10-CM | POA: Diagnosis not present

## 2021-02-06 DIAGNOSIS — E291 Testicular hypofunction: Secondary | ICD-10-CM | POA: Diagnosis not present

## 2021-02-06 DIAGNOSIS — C61 Malignant neoplasm of prostate: Secondary | ICD-10-CM | POA: Diagnosis not present

## 2021-02-06 DIAGNOSIS — D400 Neoplasm of uncertain behavior of prostate: Secondary | ICD-10-CM | POA: Diagnosis not present

## 2021-02-06 DIAGNOSIS — N5201 Erectile dysfunction due to arterial insufficiency: Secondary | ICD-10-CM | POA: Diagnosis not present

## 2021-02-07 DIAGNOSIS — E291 Testicular hypofunction: Secondary | ICD-10-CM | POA: Diagnosis not present

## 2021-02-07 DIAGNOSIS — Z79899 Other long term (current) drug therapy: Secondary | ICD-10-CM | POA: Diagnosis not present

## 2021-02-07 DIAGNOSIS — N401 Enlarged prostate with lower urinary tract symptoms: Secondary | ICD-10-CM | POA: Diagnosis not present

## 2021-03-20 DIAGNOSIS — E7849 Other hyperlipidemia: Secondary | ICD-10-CM | POA: Diagnosis not present

## 2021-07-25 DIAGNOSIS — H11041 Peripheral pterygium, stationary, right eye: Secondary | ICD-10-CM | POA: Diagnosis not present

## 2021-07-25 DIAGNOSIS — H524 Presbyopia: Secondary | ICD-10-CM | POA: Diagnosis not present

## 2021-07-25 DIAGNOSIS — H2513 Age-related nuclear cataract, bilateral: Secondary | ICD-10-CM | POA: Diagnosis not present

## 2021-07-25 DIAGNOSIS — H52223 Regular astigmatism, bilateral: Secondary | ICD-10-CM | POA: Diagnosis not present

## 2021-07-25 DIAGNOSIS — D352 Benign neoplasm of pituitary gland: Secondary | ICD-10-CM | POA: Diagnosis not present

## 2021-07-25 DIAGNOSIS — H5213 Myopia, bilateral: Secondary | ICD-10-CM | POA: Diagnosis not present

## 2021-07-25 DIAGNOSIS — H43813 Vitreous degeneration, bilateral: Secondary | ICD-10-CM | POA: Diagnosis not present

## 2021-07-31 DIAGNOSIS — N401 Enlarged prostate with lower urinary tract symptoms: Secondary | ICD-10-CM | POA: Diagnosis not present

## 2021-07-31 DIAGNOSIS — E291 Testicular hypofunction: Secondary | ICD-10-CM | POA: Diagnosis not present

## 2021-07-31 DIAGNOSIS — Z79899 Other long term (current) drug therapy: Secondary | ICD-10-CM | POA: Diagnosis not present

## 2021-07-31 DIAGNOSIS — D400 Neoplasm of uncertain behavior of prostate: Secondary | ICD-10-CM | POA: Diagnosis not present

## 2021-07-31 DIAGNOSIS — N5201 Erectile dysfunction due to arterial insufficiency: Secondary | ICD-10-CM | POA: Diagnosis not present

## 2021-07-31 DIAGNOSIS — C61 Malignant neoplasm of prostate: Secondary | ICD-10-CM | POA: Diagnosis not present

## 2021-08-15 ENCOUNTER — Ambulatory Visit: Payer: PPO | Admitting: Dermatology

## 2021-08-15 DIAGNOSIS — L821 Other seborrheic keratosis: Secondary | ICD-10-CM

## 2021-08-15 DIAGNOSIS — L570 Actinic keratosis: Secondary | ICD-10-CM

## 2021-08-15 DIAGNOSIS — L578 Other skin changes due to chronic exposure to nonionizing radiation: Secondary | ICD-10-CM

## 2021-08-15 DIAGNOSIS — L814 Other melanin hyperpigmentation: Secondary | ICD-10-CM

## 2021-08-15 NOTE — Patient Instructions (Signed)
Due to recent changes in healthcare laws, you may see results of your pathology and/or laboratory studies on MyChart before the doctors have had a chance to review them. We understand that in some cases there may be results that are confusing or concerning to you. Please understand that not all results are received at the same time and often the doctors may need to interpret multiple results in order to provide you with the best plan of care or course of treatment. Therefore, we ask that you please give us 2 business days to thoroughly review all your results before contacting the office for clarification. Should we see a critical lab result, you will be contacted sooner.   If You Need Anything After Your Visit  If you have any questions or concerns for your doctor, please call our main line at [phone] and press option 4 to reach your doctor's medical assistant. If no one answers, please leave a voicemail as directed and we will return your call as soon as possible. Messages left after 4 pm will be answered the following business day.   You may also send us a message via MyChart. We typically respond to MyChart messages within 1-2 business days.  For prescription refills, please ask your pharmacy to contact our office. Our fax number is [phone].  If you have an urgent issue when the clinic is closed that cannot wait until the next business day, you can page your doctor at the number below.    Please note that while we do our best to be available for urgent issues outside of office hours, we are not available 24/7.   If you have an urgent issue and are unable to reach us, you may choose to seek medical care at your doctor's office, retail clinic, urgent care center, or emergency room.  If you have a medical emergency, please immediately call 911 or go to the emergency department.  Pager Numbers  - Dr. Kowalski: [phone]  - Dr. Moye: [phone]  - Dr. Stewart:  [phone]  In the event of inclement weather, please call our main line at [phone] for an update on the status of any delays or closures.  Dermatology Medication Tips: Please keep the boxes that topical medications come in in order to help keep track of the instructions about where and how to use these. Pharmacies typically print the medication instructions only on the boxes and not directly on the medication tubes.   If your medication is too expensive, please contact our office at [phone] option 4 or send us a message through MyChart.   We are unable to tell what your co-pay for medications will be in advance as this is different depending on your insurance coverage. However, we may be able to find a substitute medication at lower cost or fill out paperwork to get insurance to cover a needed medication.   If a prior authorization is required to get your medication covered by your insurance company, please allow us 1-2 business days to complete this process.  Drug prices often vary depending on where the prescription is filled and some pharmacies may offer cheaper prices.  The website www.goodrx.com contains coupons for medications through different pharmacies. The prices here do not account for what the cost may be with help from insurance (it may be cheaper with your insurance), but the website can give you the price if you did not use any insurance.  - You can print the associated coupon and take it with   your prescription to the pharmacy.  - You may also stop by our office during regular business hours and pick up a GoodRx coupon card.  - If you need your prescription sent electronically to a different pharmacy, notify our office through [facility] MyChart or by phone at [phone] option 4.     Si Usted Necesita Algo Despus de Su Visita  Tambin puede enviarnos un mensaje a travs de MyChart. Por lo general respondemos a los mensajes de MyChart en el transcurso de 1 a 2  das hbiles.  Para renovar recetas, por favor pida a su farmacia que se ponga en contacto con nuestra oficina. Nuestro nmero de fax es el [phone].  Si tiene un asunto urgente cuando la clnica est cerrada y que no puede esperar hasta el siguiente da hbil, puede llamar/localizar a su doctor(a) al nmero que aparece a continuacin.   Por favor, tenga en cuenta que aunque hacemos todo lo posible para estar disponibles para asuntos urgentes fuera del horario de oficina, no estamos disponibles las 24 horas del da, los 7 das de la semana.   Si tiene un problema urgente y no puede comunicarse con nosotros, puede optar por buscar atencin mdica  en el consultorio de su doctor(a), en una clnica privada, en un centro de atencin urgente o en una sala de emergencias.  Si tiene una emergencia mdica, por favor llame inmediatamente al 911 o vaya a la sala de emergencias.  Nmeros de bper  - Dr. Kowalski: [phone]  - Dra. Moye: [phone]  - Dra. Stewart: [phone]  En caso de inclemencias del tiempo, por favor llame a nuestra lnea principal al [phone] para una actualizacin sobre el estado de cualquier retraso o cierre.  Consejos para la medicacin en dermatologa: Por favor, guarde las cajas en las que vienen los medicamentos de uso tpico para ayudarle a seguir las instrucciones sobre dnde y cmo usarlos. Las farmacias generalmente imprimen las instrucciones del medicamento slo en las cajas y no directamente en los tubos del medicamento.   Si su medicamento es muy caro, por favor, pngase en contacto con nuestra oficina llamando al [phone] y presione la opcin 4 o envenos un mensaje a travs de MyChart.   No podemos decirle cul ser su copago por los medicamentos por adelantado ya que esto es diferente dependiendo de la cobertura de su seguro. Sin embargo, es posible que podamos encontrar un medicamento sustituto a menor costo o llenar un formulario para que el  seguro cubra el medicamento que se considera necesario.   Si se requiere una autorizacin previa para que su compaa de seguros cubra su medicamento, por favor permtanos de 1 a 2 das hbiles para completar este proceso.  Los precios de los medicamentos varan con frecuencia dependiendo del lugar de dnde se surte la receta y alguna farmacias pueden ofrecer precios ms baratos.  El sitio web www.goodrx.com tiene cupones para medicamentos de diferentes farmacias. Los precios aqu no tienen en cuenta lo que podra costar con la ayuda del seguro (puede ser ms barato con su seguro), pero el sitio web puede darle el precio si no utiliz ningn seguro.  - Puede imprimir el cupn correspondiente y llevarlo con su receta a la farmacia.  - Tambin puede pasar por nuestra oficina durante el horario de atencin regular y recoger una tarjeta de cupones de GoodRx.  - Si necesita que su receta se enve electrnicamente a una farmacia diferente, informe a nuestra oficina a travs de MyChart de    o por telfono llamando al [phone] y presione la opcin 4.  

## 2021-08-15 NOTE — Progress Notes (Signed)
   Follow-Up Visit   Subjective  Zachary Hayden is a 71 y.o. male who presents for the following: Irregular lesion (L ear - sore if he presses on it, has been there for a few weeks. ) to a couple months. The patient has spots, moles and lesions to be evaluated, some may be new or changing.   The following portions of the chart were reviewed this encounter and updated as appropriate:       Review of Systems:  No other skin or systemic complaints except as noted in HPI or Assessment and Plan.  Objective  Well appearing patient in no apparent distress; mood and affect are within normal limits.  A focused examination was performed including the face and ears. Relevant physical exam findings are noted in the Assessment and Plan.    Assessment & Plan  AK (actinic keratosis) (2) L anti helix x 1, L ear mid helix x 1  Actinic keratoses are precancerous spots that appear secondary to cumulative UV radiation exposure/sun exposure over time. They are chronic with expected duration over 1 year. A portion of actinic keratoses will progress to squamous cell carcinoma of the skin. It is not possible to reliably predict which spots will progress to skin cancer and so treatment is recommended to prevent development of skin cancer.  Recommend daily broad spectrum sunscreen SPF 30+ to sun-exposed areas, reapply every 2 hours as needed.  Recommend staying in the shade or wearing long sleeves, sun glasses (UVA+UVB protection) and wide brim hats (4-inch brim around the entire circumference of the hat). Call for new or changing lesions.   Destruction of lesion - L anti helix x 1, L ear mid helix x 1  Destruction method: cryotherapy   Informed consent: discussed and consent obtained   Lesion destroyed using liquid nitrogen: Yes   Region frozen until ice ball extended beyond lesion: Yes   Outcome: patient tolerated procedure well with no complications   Post-procedure details: wound care instructions  given   Additional details:  Prior to procedure, discussed risks of blister formation, small wound, skin dyspigmentation, or rare scar following cryotherapy. Recommend Vaseline ointment to treated areas while healing.   Actinic Damage - chronic, secondary to cumulative UV radiation exposure/sun exposure over time - diffuse scaly erythematous macules with underlying dyspigmentation - Recommend daily broad spectrum sunscreen SPF 30+ to sun-exposed areas, reapply every 2 hours as needed.  - Recommend staying in the shade or wearing long sleeves, sun glasses (UVA+UVB protection) and wide brim hats (4-inch brim around the entire circumference of the hat). - Call for new or changing lesions.  Lentigines - Scattered tan macules - Due to sun exposure - Benign-appering, observe - Recommend daily broad spectrum sunscreen SPF 30+ to sun-exposed areas, reapply every 2 hours as needed. - Call for any changes  Seborrheic Keratoses - Stuck-on, waxy, tan-brown papules and/or plaques  - Benign-appearing - Discussed benign etiology and prognosis. - Observe - Call for any changes  Return for appointment as scheduled.  Luther Redo, CMA, am acting as scribe for Brendolyn Patty, MD .  Documentation: I have reviewed the above documentation for accuracy and completeness, and I agree with the above.  Brendolyn Patty MD

## 2021-09-12 DIAGNOSIS — Z7689 Persons encountering health services in other specified circumstances: Secondary | ICD-10-CM | POA: Diagnosis not present

## 2021-09-12 DIAGNOSIS — Z1329 Encounter for screening for other suspected endocrine disorder: Secondary | ICD-10-CM | POA: Diagnosis not present

## 2021-11-28 ENCOUNTER — Telehealth: Payer: PPO | Admitting: Internal Medicine

## 2021-11-28 ENCOUNTER — Encounter: Payer: Self-pay | Admitting: Internal Medicine

## 2021-11-28 VITALS — BP 128/84 | HR 71 | Ht 71.0 in | Wt 187.6 lb

## 2021-11-28 DIAGNOSIS — D497 Neoplasm of unspecified behavior of endocrine glands and other parts of nervous system: Secondary | ICD-10-CM | POA: Diagnosis not present

## 2021-11-28 DIAGNOSIS — E221 Hyperprolactinemia: Secondary | ICD-10-CM

## 2021-11-28 NOTE — Progress Notes (Addendum)
Patient ID: Zachary Hayden, male   DOB: May 30, 1950, 71 y.o.   MRN: [id number]  HPI  Zachary Hayden is a 71 y.o.-year-old male, initially referred by his PCP, Dion Body, MD, for management of a pituitary tumor, hyperprolactinemia, and hypogonadotropic hypogonadism. Last visit 1 year ago.  Interim history: At today's visit, he feels well, without complaints.  No headaches, vision problems, increased fatigue, breast tenderness or galactorrhea. He continues on testosterone gel per urology.  He was initially on 2 pumps daily, then on 1 pump daily with good results in terms of improved erections and libido. He gets his testosterone tested 2x a year >> usually 275-350.  Reviewed patient's pituitary tumor history: Pt was dx'ed with hyperprolactinemia since 2000. He was first diagnosed  On imaging in 2005 when his prolactin was 960, and pituitary MRI showed a 15 x 15  x 20 mm lesion. He has had good suppression of his prolactin with bromocriptine, but when it was tried to be tapered off after 3 years 06/2009, his prolactin levels started to rise in normal range. Otherwise it has been well suppressed . He was on bromocriptine 2.5 twice daily, no problems with fatigue,nausea, LH, or headaches. No breast discharge. Has stable mild enlargement of the breasts bilaterally without masses.  Additionally, he had hypogonadism when the prolactin was high, which initially recovered when the prolactin was suppressed, but recurred despite PRL being normal. He also developed prostate cancer treated with prostatectomy.  He had low libido, mild erectile dysfunction. In the past, he has noticed the significant decrease in libido when his prolactin has risen and testosterone has declined.  He had occasional hot flashes but no muscle weakness.  He was previously on Cabergoline for approximately a year but because of price, ~4 years ago, tried Bromocriptine.  He also had a trial of bromocriptine 2016, however, his  prolactin increased.  He was able to restart Cabergoline 02/2014.  He continues on Cabergoline 0.25 mg weekly since 09/2016.  He tolerates this well, without dizziness, headaches, nausea, congestion.  Labs and imaging reports reviewed: Since last visit, we checked another pituitary MRI (12/21/2019): His pituitary adenoma decreased in size: Brain: No acute infarction, hemorrhage, hydrocephalus or extra-axial collection.   A few scattered foci of T2 hyperintensity are seen within the white matter of the cerebral hemispheres, nonspecific. Mild parenchymal volume loss.   Pituitary/Sella: A T1 hypointense, nonenhancing lesion is seen within the right side of the sella and extending into the right cavernous sinus, measuring approximately 11 x 8 x 7 mm. No extension into the suprasellar cistern. Normal pituitary gland is mildly deviated to the left. The pituitary stalk has normal thickness and is mildly deviated to the left. The hypothalamus and mamillary bodies are normal. There is no mass effect on the optic chiasm or optic nerves. The infundibular and chiasmatic recesses are clear. Normal cavernous internal carotid artery flow voids.   Vascular: Normal flow voids.   Skull and upper cervical spine: Normal marrow signal.   Sinuses/Orbits: Mucosal thickening throughout the paranasal sinuses, more pronounced in the ethmoid cells. The orbits are maintained.   Other: None.   IMPRESSION: 1. A 11 x 8 x 7 mm lesion within the right side of the sella and extending into the right cavernous sinus, consistent with a pituitary adenoma. No extension into the suprasellar cistern. 2. A few scattered foci of T2 hyperintensity within the white matter of the cerebral hemispheres, nonspecific. This may represent microvascular ischemic changes. 3. Paranasal  sinus disease, more pronounced in the ethmoid cells.  Reviewed prolactin levels: Lab Results  Component Value Date   PROLACTIN 6.6 11/24/2020    PROLACTIN 5.4 11/25/2019   PROLACTIN 5.8 10/14/2018   PROLACTIN 5.5 10/10/2017   PROLACTIN 5.1 02/19/2017   PROLACTIN 4.4 10/10/2016   PROLACTIN 5.9 10/12/2015   PROLACTIN 4.3 12/27/2014   PROLACTIN 2.4 10/11/2014   PROLACTIN 3.6 07/07/2014   Reviewed previous pertinent labs: Component     Latest Ref Rng & Units 02/19/2017  Testosterone     264 - 916 ng/dL 92 (L)  Testosterone Free     6.6 - 18.1 pg/mL 2.0 (L)  Sex Horm Binding Glob, Serum     19.3 - 76.4 nmol/L 118.1 (H)   Lab Results  Component Value Date   WBC 6.8 02/19/2017   HGB 13.9 02/19/2017   HCT 41.2 02/19/2017   MCV 88.3 02/19/2017   PLT 214.0 02/19/2017  No results found for: "PSA"  Previously: 11/1998 - PRL 548.8, TSH 1.91, T4 8.6 2001- PRL 660 01/2003 - FSH<0.3, LH <0.3, PRL 939 02/2003 - MRI [facility]- enlarged pituitary gland slight to right of midline, homogenous enhancement post contrast, no stalk deviation, no impingement on optic chiasm, no narrowing of cavernous sinus, ICA; tumor 1.5 x1.5 x2cm, enhancing nodule within pituitary as well 04/2003- TSH 2.69, free T4 0.95, PRL 862, cortisol 13.4, Testosterone 298 ( 179-756), IGF-1 178 ( 90-360) 11/2003 - Testosterone low at 20- was on Androgel at that time 01/2004 - PRL 9 04/2004 - PRL 6, Testosterone 354 01/2005 - MRI: 46m pituitary mass, decreased T1 signal , increased T2 and hypoenhancement right aspect of sella, displacing pituitary to left 02/2005- PRL 3, Testosterone 257 02/2006-PRL 5, Testosterone 318 03/2008 PRL 3, Testosterone 278 02/2009- PRL 5, Testosterone 310>>dose reduced to bromocriptine 2.5 mg daily 05/2009 -PRL 7, Testos 271 10/2009-PRL 10 03/2010 -PRL 9>>dose increased to '5mg'$  daily 03/2011-PRL 3, Testos 330 01/2012 - MRI pituitary , [facility]- compared to 2010: 1.5 x 1.1 cm enhacing mass in the right sella involving right cavernous sinus and displacing pitiutary stalk to left (grossly unchanged appearance as compared to prior) 02/2012-PRL 6, Testosterone  329 2015 - VF normal. 04/19/2014 MRI: stable prolactinoma within right side of pituitary ( 1.5 x1.2 x1.2 cm) compared to 2013 scan. There was mild left deviation of the pituitary stalk, unchanged.  03/2014: Testosterone: 205 (179-756), am, fasting 06/2015: nonfasting: t Testosterone: 174 (before the weight loss) 10/12/2015: Total testosterone 190 (264-916), SHBG 122.6 (19.3-76.4), free testosterone 2.1 (6.6-18.1)  07/2016: nonfasting: t testosterone 144; estradiol 46.2 at urologist office, PSA 0.0 07/2019: Testosterone reportedly 273 07/2020: Testosterone reportedly 299  We initially started Clomid (with Urology consent): 25 mg daily.  He developed more hot flashes on this and no increased libido or improvement in erections.  Therefore, we stopped Clomid and I again suggested to start testosterone replacement.  This is now managed by urology per patient's preference (Dr. WYves Dill.  He is now on compounded testosterone-5 g daily ([facility] in [place].   He also has a history of HL (off statin; on Red yeast rice), GERD. On Biotin 30 mcg daily.  ROS: + See HPI  I reviewed pt's medications, allergies, PMH, social hx, family hx, and changes were documented in the history of present illness. Otherwise, unchanged from my initial visit note.  Past Medical History:  Diagnosis Date   Allergy    Basal cell carcinoma 08/27/2018   Right top of shoulder. Superficial and nodular  patterns.   Cancer [facility]) 2005   prostate   Colon polyp 2012   3 polyps   GERD (gastroesophageal reflux disease)    Hyperlipidemia    Internal hemorrhoids    Pituitary adenoma ([place]) 1992   Prostate cancer ([place]) 2005   Past Surgical History:  Procedure Laterality Date   COLONOSCOPY  2010, 2012, 2014   Dr. Dionne Milo   COLONOSCOPY WITH PROPOFOL N/A 04/09/2017   Procedure: COLONOSCOPY WITH PROPOFOL;  Surgeon: Toledo, Benay Pike, MD;  Location: [facility] ENDOSCOPY;  Service: Gastroenterology;  Laterality: N/A;    ESOPHAGOGASTRODUODENOSCOPY (EGD) WITH PROPOFOL N/A 04/09/2017   Procedure: ESOPHAGOGASTRODUODENOSCOPY (EGD) WITH PROPOFOL;  Surgeon: Toledo, Benay Pike, MD;  Location: [facility] ENDOSCOPY;  Service: Gastroenterology;  Laterality: N/A;   PROSTATE SURGERY  2005   radical   PROSTATECTOMY     Social History   Social History   Marital Status: Married    Spouse Name: N/A   Number of Children: 4   Occupational History    Pastor   Social History Main Topics   Smoking status: Never Smoker    Smokeless tobacco: Never Used   Alcohol Use: 0.0 oz/week    0 Standard drinks or equivalent per week     Comment: glass of wine monthly    Drug Use: No   Current Outpatient Medications on File Prior to Visit  Medication Sig Dispense Refill   Acetaminophen 500 MG capsule Take by mouth.     Arginine 1000 MG TABS Take by mouth.     aspirin EC 81 MG tablet Take 81 mg by mouth daily.     cabergoline (DOSTINEX) 0.5 MG tablet TAKE 1/2 TABLET ONCE A WEEK 10 tablet 3   CALCIUM CITRATE PO Take by mouth.     Cholecalciferol (VITAMIN D3 PO) Take by mouth.     Coenzyme Q10 (COQ-10 PO) Take by mouth.     Cyanocobalamin 1000 MCG/15ML LIQD Take 5,000 mcg by mouth daily.     Cyanocobalamin 5000 MCG/ML LIQD Place under the tongue.     diphenhydrAMINE (BENADRYL) 25 MG tablet Take 25 mg by mouth every 6 (six) hours as needed.     docusate sodium (COLACE) 100 MG capsule Take 100 mg by mouth 2 (two) times daily.     famotidine (PEPCID) 20 MG tablet Take 20 mg by mouth every other day.      Ginkgo Biloba Extract 60 MG CAPS Take by mouth.     Krill Oil 1000 MG CAPS Take by mouth.     MAGNESIUM PO Take by mouth.     Melatonin 10 MG TABS Take by mouth.     Multiple Vitamin (MULTIVITAMIN) tablet Take 1 tablet by mouth daily.     Multiple Vitamins-Minerals (ZINC PO) Take by mouth.     NATTOKINASE PO Take by mouth.     niacin 100 MG tablet Take 100 mg by mouth at bedtime.     omeprazole (PRILOSEC OTC) 20 MG tablet Take 20 mg by  mouth every other day.      pravastatin (PRAVACHOL) 20 MG tablet Take 20 mg by mouth at bedtime. (Patient not taking: Reported on 08/15/2021)     Probiotic Product (PROBIOTIC DAILY PO) Take by mouth.     PSYLLIUM PO Take by mouth.     Pyridoxine HCl (VITAMIN B6 PO) Take by mouth.     QUERCETIN PO Take by mouth.     Red Yeast Rice Extract (RED YEAST RICE PO) Take by mouth.  sildenafil (VIAGRA) 100 MG tablet Take 100 mg by mouth as needed for erectile dysfunction.     Testosterone 20 % CREA by Does not apply route.     vitamin C (ASCORBIC ACID) 500 MG tablet Take 500 mg by mouth daily.     vitamin C (ASCORBIC ACID) 500 MG tablet Take 500 mg by mouth daily.     VITAMIN D, CHOLECALCIFEROL, PO Take by mouth.     vitamin k 100 MCG tablet Take 100 mcg by mouth daily.     No current facility-administered medications on file prior to visit.   No Known Allergies Family History  Problem Relation Age of Onset   Other Cousin    Heart disease Father    Hyperlipidemia Father    Hypertension Father    PE: BP 128/84 (BP Location: Right Arm, Patient Position: Sitting, Cuff Size: Normal)   Pulse 71   Ht '5\' 11"'$  (1.803 m)   Wt 187 lb 9.6 oz (85.1 kg)   SpO2 99%   BMI 26.16 kg/m   Wt Readings from Last 3 Encounters:  11/28/21 187 lb 9.6 oz (85.1 kg)  11/24/20 184 lb 12.8 oz (83.8 kg)  11/25/19 187 lb 9.6 oz (85.1 kg)   Constitutional: overweight, in NAD Eyes:  EOMI, no exophthalmos ENT: no neck masses, no cervical lymphadenopathy Cardiovascular: RRR, No MRG Respiratory: CTA B Musculoskeletal: no deformities Skin:no rashes Neurological: no tremor with outstretched hands  ASSESSMENT: 1. Pituitary tumor  2.  Hyperprolactinemia  3. Hypogonadotropic hypogonadism  PLAN:  1.  Patient with history of pituitary tumor diagnosed in 2000.  This has decreased in size after starting treatment with bromocriptine and remains stable at 1.5 cm based on his pituitary MRI from 04/19/2014.  However,  afterwards, he started Cabergoline and on the latest MRI from 2021, the pituitary tumor size decreased to 1.1 cm - this is a good result. -Since his adenoma is not enclosed proximity or impinging on the optic chiasm, I do not feel that we need to repeat visual fields.  The latest was from 2016 and this was normal -At today's visit, he denies headaches, visual disturbance -We discussed about possibly repeating the MRI to see if the tumor shrunk further.  He would like to postpone this for 1 year.  We will obtain this at next visit. -We will see him back in 1 year  2. Hyperprolactinemia -Likely related to his pituitary tumor (prolactinoma) -He was initially on bromocriptine due to price.  He tried dopamine agonist in the past but prolactin level increased -He is currently on Cabergoline, which he tolerates well, without side effects. -At last visit, his prolactin level was excellent so we continued to same dose of Cabergoline, 0.25 mg weekly -We discussed that with smaller pituitary tumors, we may consider Cabergoline holidays, but not usually for macroadenomas -We will recheck his prolactin level today -if this continues to improve, we can decrease the Cabergoline to 0.25 mg every other week  3. Hypogonadotropic hypogonadism -Now managed by urology - Patient has had low testosterone levels due to his high prolactin levels in the past and the levels remained low despite efficacy of the prolactinoma treatment.  He did complain of low libido and problems with erections and he was interested in starting treatment for his low testosterone level.  We discussed  that we have to be very careful with this due to previous history of prostate cancer.  Of note, his urologist gave [place] the ok to use testosterone supplement.  Since he also had a high estrogen level at the time at his visit with a urologist in 07/2016, I suggested to try Clomid first.  We started this in 09/2016.  He noticed more hot flashes but no  perceived improvement in libido and erections - Reviewed previous testosterone levels: testosterone level was low (total testosterone 144), but he was not fasting at the time of his last visit with his urologist (he usually sees him in June of every year); we repeated his labs on 10/10/2016, fasting, and the testosterone was still low (total testosterone 158, free testosterone 2.3).  Estrogen level has been normal (10/10/2016: Free estradiol level 11, normal <29).  Reportedly his most recent testosterone from 07/2020 was normal at 299. -I suggested testosterone treatment and he was cleared for this by his urologist (Dr. Eliberto Ivory).  He started the patient on compounded testosterone cream (cheaper for him) - doing great on this  Needs refills.  Component     Latest Ref Rng 11/28/2021  Prolactin     2.0 - 18.0 ng/mL 3.4    PRL excellent - will try to decrease the dose of Cabergoline to 0.25 mg every other week.  We will need to repeat the test in 1.5-2 months.  Component     Latest Ref Rng 01/15/2022  Prolactin     2.0 - 18.0 ng/mL 6.5   Prolactin levels remains well controlled, only slightly higher than before.  We can continue with the current dose of Cabergoline for now.  Philemon Kingdom, MD PhD [facility]

## 2021-11-28 NOTE — Patient Instructions (Signed)
Please stop at the lab.  Continue Cabergoline 0.25 mg weekly.  Please come back for a follow-up appointment in 1 year.

## 2021-11-29 LAB — PROLACTIN: Prolactin: 3.4 ng/mL (ref 2.0–18.0)

## 2021-11-29 MED ORDER — CABERGOLINE 0.5 MG PO TABS: ORAL_TABLET | ORAL | 3 refills | Status: DC

## 2021-12-13 ENCOUNTER — Ambulatory Visit: Payer: PPO | Admitting: Dermatology

## 2021-12-21 ENCOUNTER — Ambulatory Visit: Payer: PPO | Admitting: Dermatology

## 2021-12-21 DIAGNOSIS — D229 Melanocytic nevi, unspecified: Secondary | ICD-10-CM | POA: Diagnosis not present

## 2021-12-21 DIAGNOSIS — Z1283 Encounter for screening for malignant neoplasm of skin: Secondary | ICD-10-CM | POA: Diagnosis not present

## 2021-12-21 DIAGNOSIS — L814 Other melanin hyperpigmentation: Secondary | ICD-10-CM

## 2021-12-21 DIAGNOSIS — L578 Other skin changes due to chronic exposure to nonionizing radiation: Secondary | ICD-10-CM

## 2021-12-21 DIAGNOSIS — L918 Other hypertrophic disorders of the skin: Secondary | ICD-10-CM

## 2021-12-21 DIAGNOSIS — Z85828 Personal history of other malignant neoplasm of skin: Secondary | ICD-10-CM

## 2021-12-21 DIAGNOSIS — L821 Other seborrheic keratosis: Secondary | ICD-10-CM | POA: Diagnosis not present

## 2021-12-21 NOTE — Patient Instructions (Signed)
Due to recent changes in healthcare laws, you may see results of your pathology and/or laboratory studies on MyChart before the doctors have had a chance to review them. We understand that in some cases there may be results that are confusing or concerning to you. Please understand that not all results are received at the same time and often the doctors may need to interpret multiple results in order to provide you with the best plan of care or course of treatment. Therefore, we ask that you please give us 2 business days to thoroughly review all your results before contacting the office for clarification. Should we see a critical lab result, you will be contacted sooner.   If You Need Anything After Your Visit  If you have any questions or concerns for your doctor, please call our main line at [phone] and press option 4 to reach your doctor's medical assistant. If no one answers, please leave a voicemail as directed and we will return your call as soon as possible. Messages left after 4 pm will be answered the following business day.   You may also send us a message via MyChart. We typically respond to MyChart messages within 1-2 business days.  For prescription refills, please ask your pharmacy to contact our office. Our fax number is [phone].  If you have an urgent issue when the clinic is closed that cannot wait until the next business day, you can page your doctor at the number below.    Please note that while we do our best to be available for urgent issues outside of office hours, we are not available 24/7.   If you have an urgent issue and are unable to reach us, you may choose to seek medical care at your doctor's office, retail clinic, urgent care center, or emergency room.  If you have a medical emergency, please immediately call 911 or go to the emergency department.  Pager Numbers  - Dr. Kowalski: [phone]  - Dr. Moye: [phone]  - Dr. Stewart:  [phone]  In the event of inclement weather, please call our main line at [phone] for an update on the status of any delays or closures.  Dermatology Medication Tips: Please keep the boxes that topical medications come in in order to help keep track of the instructions about where and how to use these. Pharmacies typically print the medication instructions only on the boxes and not directly on the medication tubes.   If your medication is too expensive, please contact our office at [phone] option 4 or send us a message through MyChart.   We are unable to tell what your co-pay for medications will be in advance as this is different depending on your insurance coverage. However, we may be able to find a substitute medication at lower cost or fill out paperwork to get insurance to cover a needed medication.   If a prior authorization is required to get your medication covered by your insurance company, please allow us 1-2 business days to complete this process.  Drug prices often vary depending on where the prescription is filled and some pharmacies may offer cheaper prices.  The website www.goodrx.com contains coupons for medications through different pharmacies. The prices here do not account for what the cost may be with help from insurance (it may be cheaper with your insurance), but the website can give you the price if you did not use any insurance.  - You can print the associated coupon and take it with   your prescription to the pharmacy.  - You may also stop by our office during regular business hours and pick up a GoodRx coupon card.  - If you need your prescription sent electronically to a different pharmacy, notify our office through [place] MyChart or by phone at [phone] option 4.     Si Usted Necesita Algo Despus de Su Visita  Tambin puede enviarnos un mensaje a travs de MyChart. Por lo general respondemos a los mensajes de MyChart en el transcurso de 1 a 2  das hbiles.  Para renovar recetas, por favor pida a su farmacia que se ponga en contacto con nuestra oficina. Nuestro nmero de fax es el [phone].  Si tiene un asunto urgente cuando la clnica est cerrada y que no puede esperar hasta el siguiente da hbil, puede llamar/localizar a su doctor(a) al nmero que aparece a continuacin.   Por favor, tenga en cuenta que aunque hacemos todo lo posible para estar disponibles para asuntos urgentes fuera del horario de oficina, no estamos disponibles las 24 horas del da, los 7 das de la semana.   Si tiene un problema urgente y no puede comunicarse con nosotros, puede optar por buscar atencin mdica  en el consultorio de su doctor(a), en una clnica privada, en un centro de atencin urgente o en una sala de emergencias.  Si tiene una emergencia mdica, por favor llame inmediatamente al 911 o vaya a la sala de emergencias.  Nmeros de bper  - Dr. Kowalski: [phone]  - Dra. Moye: [phone]  - Dra. Stewart: [phone]  En caso de inclemencias del tiempo, por favor llame a nuestra lnea principal al [phone] para una actualizacin sobre el estado de cualquier retraso o cierre.  Consejos para la medicacin en dermatologa: Por favor, guarde las cajas en las que vienen los medicamentos de uso tpico para ayudarle a seguir las instrucciones sobre dnde y cmo usarlos. Las farmacias generalmente imprimen las instrucciones del medicamento slo en las cajas y no directamente en los tubos del medicamento.   Si su medicamento es muy caro, por favor, pngase en contacto con nuestra oficina llamando al [phone] y presione la opcin 4 o envenos un mensaje a travs de MyChart.   No podemos decirle cul ser su copago por los medicamentos por adelantado ya que esto es diferente dependiendo de la cobertura de su seguro. Sin embargo, es posible que podamos encontrar un medicamento sustituto a menor costo o llenar un formulario para que el  seguro cubra el medicamento que se considera necesario.   Si se requiere una autorizacin previa para que su compaa de seguros cubra su medicamento, por favor permtanos de 1 a 2 das hbiles para completar este proceso.  Los precios de los medicamentos varan con frecuencia dependiendo del lugar de dnde se surte la receta y alguna farmacias pueden ofrecer precios ms baratos.  El sitio web www.goodrx.com tiene cupones para medicamentos de diferentes farmacias. Los precios aqu no tienen en cuenta lo que podra costar con la ayuda del seguro (puede ser ms barato con su seguro), pero el sitio web puede darle el precio si no utiliz ningn seguro.  - Puede imprimir el cupn correspondiente y llevarlo con su receta a la farmacia.  - Tambin puede pasar por nuestra oficina durante el horario de atencin regular y recoger una tarjeta de cupones de GoodRx.  - Si necesita que su receta se enve electrnicamente a una farmacia diferente, informe a nuestra oficina a travs de MyChart de [place]   o por telfono llamando al [phone] y presione la opcin 4.  

## 2021-12-21 NOTE — Progress Notes (Signed)
    Follow-Up Visit   Subjective  Zachary Hayden is a 71 y.o. male who presents for the following: Annual Exam (Hx BCC ). The patient presents for Total-Body Skin Exam (TBSE) for skin cancer screening and mole check.  The patient has spots, moles and lesions to be evaluated, some may be new or changing and the patient has concerns that these could be cancer.  The following portions of the chart were reviewed this encounter and updated as appropriate:   Tobacco  Allergies  Meds  Problems  Med Hx  Surg Hx  Fam Hx     Review of Systems:  No other skin or systemic complaints except as noted in HPI or Assessment and Plan.  Objective  Well appearing patient in no apparent distress; mood and affect are within normal limits.  A full examination was performed including scalp, head, eyes, ears, nose, lips, neck, chest, axillae, abdomen, back, buttocks, bilateral upper extremities, bilateral lower extremities, hands, feet, fingers, toes, fingernails, and toenails. All findings within normal limits unless otherwise noted below.   Assessment & Plan   Lentigines - Scattered tan macules - Due to sun exposure - Benign-appearing, observe - Recommend daily broad spectrum sunscreen SPF 30+ to sun-exposed areas, reapply every 2 hours as needed. - Call for any changes  Seborrheic Keratoses - Stuck-on, waxy, tan-brown papules and/or plaques  - Benign-appearing - Discussed benign etiology and prognosis. - Observe - Call for any changes  Melanocytic Nevi - Tan-brown and/or pink-flesh-colored symmetric macules and papules - Benign appearing on exam today - Observation - Call clinic for new or changing moles - Recommend daily use of broad spectrum spf 30+ sunscreen to sun-exposed areas.   Hemangiomas - Red papules - Discussed benign nature - Observe - Call for any changes  Actinic Damage - Chronic condition, secondary to cumulative UV/sun exposure - diffuse scaly erythematous macules  with underlying dyspigmentation - Recommend daily broad spectrum sunscreen SPF 30+ to sun-exposed areas, reapply every 2 hours as needed.  - Staying in the shade or wearing long sleeves, sun glasses (UVA+UVB protection) and wide brim hats (4-inch brim around the entire circumference of the hat) are also recommended for sun protection.  - Call for new or changing lesions.  History of Basal Cell Carcinoma of the Skin - No evidence of recurrence today - Recommend regular full body skin exams - Recommend daily broad spectrum sunscreen SPF 30+ to sun-exposed areas, reapply every 2 hours as needed.  - Call if any new or changing lesions are noted between office visits  Acrochordons (Skin Tags) - Fleshy, skin-colored pedunculated papules - Benign appearing.  - Observe. - If desired, they can be removed with an in office procedure that is not covered by insurance. - Please call the clinic if you notice any new or changing lesions.  Skin cancer screening performed today.  Return in about 1 year (around 12/22/2022) for TBSE.  Luther Redo, CMA, am acting as scribe for Sarina Ser, MD . Documentation: I have reviewed the above documentation for accuracy and completeness, and I agree with the above.  Sarina Ser, MD

## 2021-12-29 ENCOUNTER — Encounter: Payer: Self-pay | Admitting: Dermatology

## 2022-01-07 ENCOUNTER — Encounter: Payer: Self-pay | Admitting: Internal Medicine

## 2022-01-15 ENCOUNTER — Other Ambulatory Visit (INDEPENDENT_AMBULATORY_CARE_PROVIDER_SITE_OTHER): Payer: PPO

## 2022-01-15 DIAGNOSIS — D497 Neoplasm of unspecified behavior of endocrine glands and other parts of nervous system: Secondary | ICD-10-CM | POA: Diagnosis not present

## 2022-01-15 DIAGNOSIS — E221 Hyperprolactinemia: Secondary | ICD-10-CM

## 2022-01-16 LAB — PROLACTIN: Prolactin: 6.5 ng/mL (ref 2.0–18.0)

## 2022-01-29 ENCOUNTER — Other Ambulatory Visit: Payer: PPO

## 2022-03-20 DIAGNOSIS — Z008 Encounter for other general examination: Secondary | ICD-10-CM | POA: Diagnosis not present

## 2022-04-18 DIAGNOSIS — E7800 Pure hypercholesterolemia, unspecified: Secondary | ICD-10-CM | POA: Diagnosis not present

## 2022-04-18 DIAGNOSIS — R030 Elevated blood-pressure reading, without diagnosis of hypertension: Secondary | ICD-10-CM | POA: Diagnosis not present

## 2022-04-18 DIAGNOSIS — E782 Mixed hyperlipidemia: Secondary | ICD-10-CM | POA: Diagnosis not present

## 2022-04-18 DIAGNOSIS — R0602 Shortness of breath: Secondary | ICD-10-CM | POA: Diagnosis not present

## 2022-04-19 ENCOUNTER — Other Ambulatory Visit: Payer: Self-pay | Admitting: Physician Assistant

## 2022-04-19 DIAGNOSIS — E7800 Pure hypercholesterolemia, unspecified: Secondary | ICD-10-CM

## 2022-04-19 DIAGNOSIS — R0602 Shortness of breath: Secondary | ICD-10-CM

## 2022-04-19 DIAGNOSIS — E782 Mixed hyperlipidemia: Secondary | ICD-10-CM

## 2022-04-23 ENCOUNTER — Ambulatory Visit
Admission: RE | Admit: 2022-04-23 | Discharge: 2022-04-23 | Disposition: A | Discharge status: Home / Self Care | Payer: PPO | Source: Ambulatory Visit | Attending: Physician Assistant | Admitting: Physician Assistant

## 2022-04-23 DIAGNOSIS — E782 Mixed hyperlipidemia: Secondary | ICD-10-CM

## 2022-04-23 DIAGNOSIS — E7800 Pure hypercholesterolemia, unspecified: Secondary | ICD-10-CM

## 2022-04-23 DIAGNOSIS — R0602 Shortness of breath: Secondary | ICD-10-CM

## 2022-04-25 DIAGNOSIS — R0602 Shortness of breath: Secondary | ICD-10-CM | POA: Diagnosis not present

## 2022-05-13 DIAGNOSIS — E7800 Pure hypercholesterolemia, unspecified: Secondary | ICD-10-CM | POA: Diagnosis not present

## 2022-05-13 DIAGNOSIS — Z23 Encounter for immunization: Secondary | ICD-10-CM | POA: Diagnosis not present

## 2022-05-13 DIAGNOSIS — R0602 Shortness of breath: Secondary | ICD-10-CM | POA: Diagnosis not present

## 2022-08-07 DIAGNOSIS — H2513 Age-related nuclear cataract, bilateral: Secondary | ICD-10-CM | POA: Diagnosis not present

## 2022-08-07 DIAGNOSIS — H524 Presbyopia: Secondary | ICD-10-CM | POA: Diagnosis not present

## 2022-08-07 DIAGNOSIS — H11041 Peripheral pterygium, stationary, right eye: Secondary | ICD-10-CM | POA: Diagnosis not present

## 2022-08-07 DIAGNOSIS — D352 Benign neoplasm of pituitary gland: Secondary | ICD-10-CM | POA: Diagnosis not present

## 2022-08-07 DIAGNOSIS — H5213 Myopia, bilateral: Secondary | ICD-10-CM | POA: Diagnosis not present

## 2022-08-07 DIAGNOSIS — H43813 Vitreous degeneration, bilateral: Secondary | ICD-10-CM | POA: Diagnosis not present

## 2022-08-07 DIAGNOSIS — H52223 Regular astigmatism, bilateral: Secondary | ICD-10-CM | POA: Diagnosis not present

## 2022-08-16 DIAGNOSIS — K219 Gastro-esophageal reflux disease without esophagitis: Secondary | ICD-10-CM | POA: Diagnosis not present

## 2022-08-16 DIAGNOSIS — Z1211 Encounter for screening for malignant neoplasm of colon: Secondary | ICD-10-CM | POA: Diagnosis not present

## 2022-08-16 DIAGNOSIS — K2270 Barrett's esophagus without dysplasia: Secondary | ICD-10-CM | POA: Diagnosis not present

## 2022-08-16 DIAGNOSIS — Z86010 Personal history of colonic polyps: Secondary | ICD-10-CM | POA: Diagnosis not present

## 2022-10-22 IMAGING — MR MR HEAD WO/W CM
19 series · 48 of 48 positions shown · IV contrast (Multihance 10ml)
Comparison: None.

CLINICAL DATA: Brain/CNS neoplasm.  Surveillance.

EXAM:
MRI HEAD WITHOUT AND WITH CONTRAST
TECHNIQUE: Multiplanar, multiecho pulse sequences of the brain and surrounding
structures were obtained without and with intravenous contrast.
CONTRAST:  10mL MULTIHANCE GADOBENATE DIMEGLUMINE 529 MG/ML IV SOLN

[Series 5: T1 · sagittal · 4.0mm · 0.78mm/px · 2 of 34 slices shown (1 of 5)]
[im 1/34]
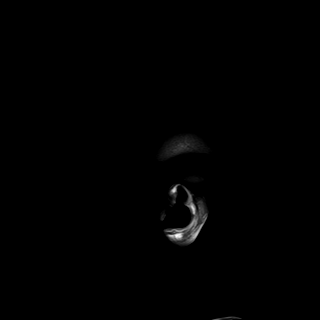
[im 34/34]
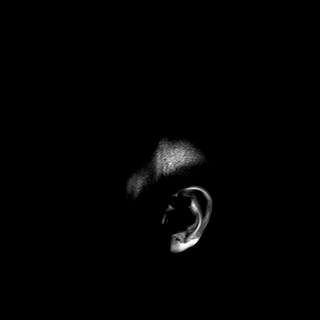

[Series 6: DWI · axial · 3.0mm · 1.44mm/px · z∈[-48,+106]mm · 5 of 84 slices shown (1 of 2)]
[im 1/84]
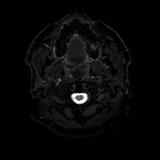
[im 21/84]
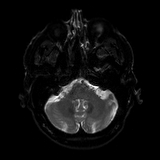
[im 42/84]
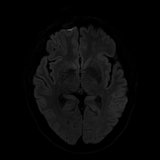
[im 63/84]
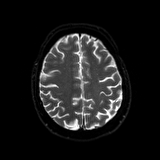
[im 84/84]
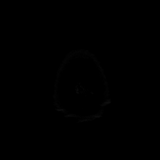

[Series 7: DWI · axial · 3.0mm · 1.44mm/px · z∈[-48,+106]mm · 2 of 42 slices shown (2 of 2)]
[im 1/42]
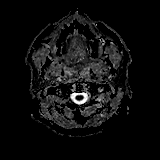
[im 42/42]
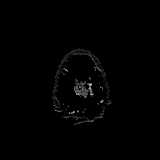

[Series 8: T2 · axial · 4.0mm · 0.36mm/px · z∈[-46,+120]mm · 2 of 34 slices shown]
[im 1/34]
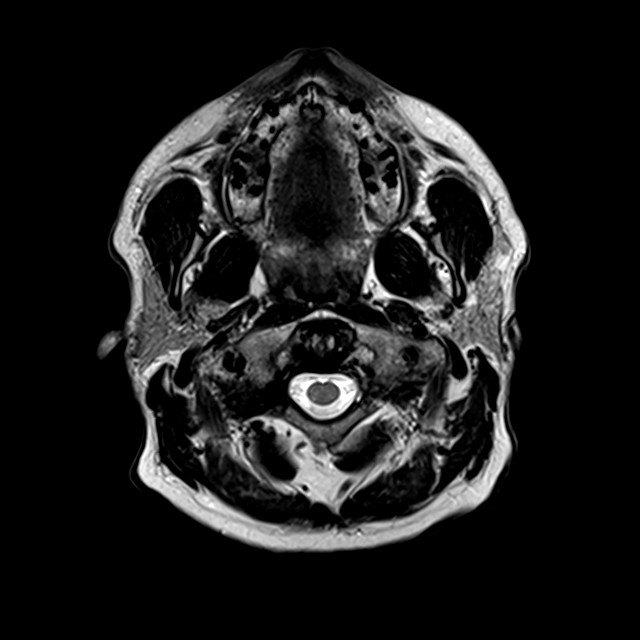
[im 34/34]
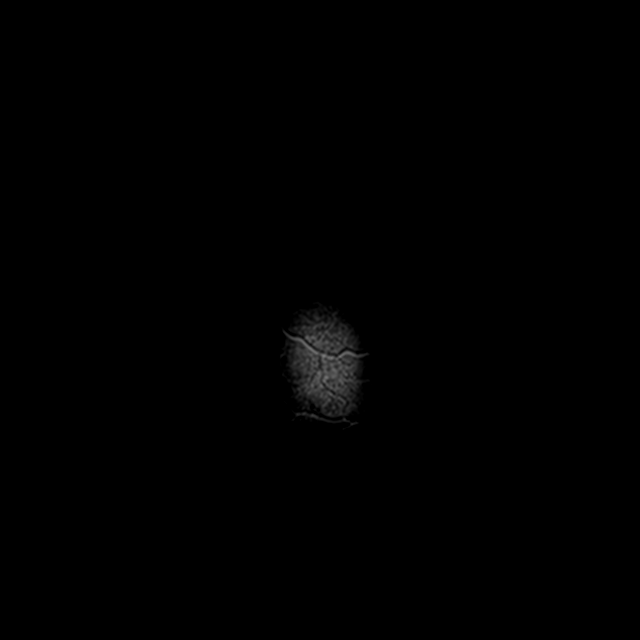

[Series 10: swi_images · axial · 1.5mm · 0.90mm/px · z∈[-38,+111]mm · 8 of 104 slices shown]
[im 1/104]
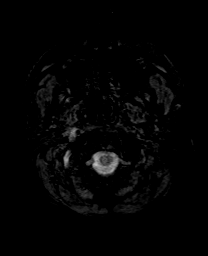
[im 15/104]
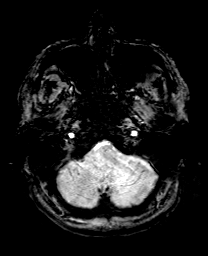
[im 30/104]
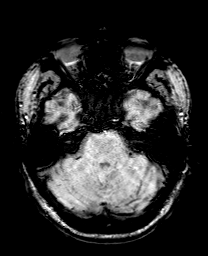
[im 45/104]
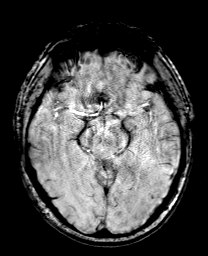
[im 59/104]
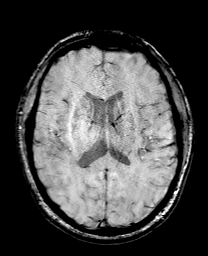
[im 74/104]
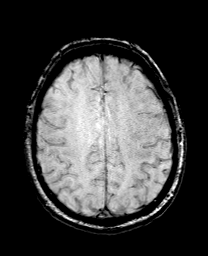
[im 89/104]
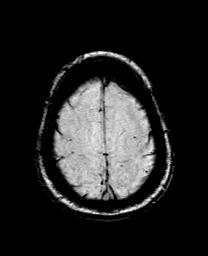
[im 104/104]
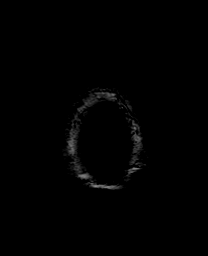

[Series 11: FLAIR · axial · 3.0mm · 0.72mm/px · z∈[-47,+121]mm · 2 of 30 slices shown]
[im 1/30]
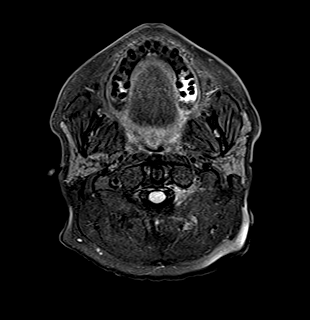
[im 30/30]
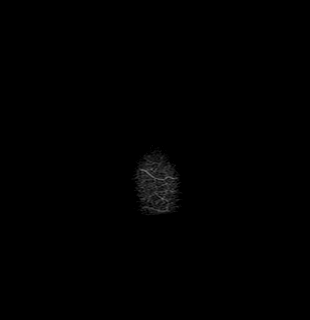

[Series 12: T1 · sagittal · 3.0mm · 0.42mm/px · 1 of 13 slices shown (2 of 5)]
[im 1/13]
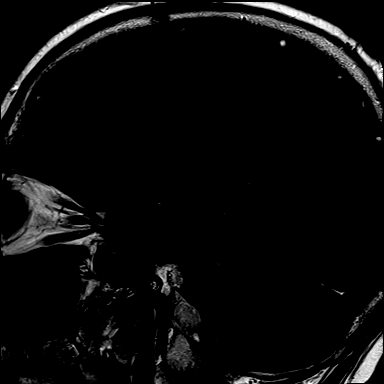

[Series 13: T1 · coronal · 3.0mm · 0.42mm/px · 1 of 10 slices shown (3 of 5)]
[im 1/10]
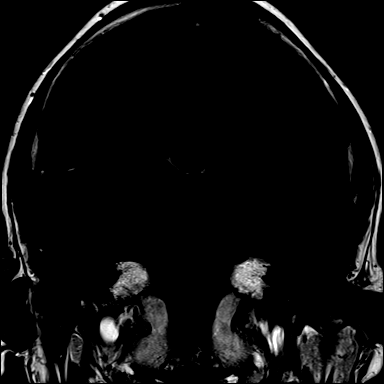

[Series 14: T1 · coronal · non-contrast · 3.0mm · 0.62mm/px · 1 of 9 slices shown (4 of 5)]
[im 1/9]
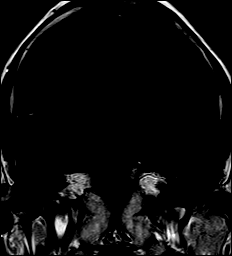

[Series 15: cor post dyn · coronal · 3.0mm · 0.62mm/px · 1 of 9 slices shown (1 of 6)]
[im 1/9]
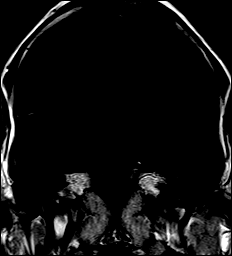

[Series 16: cor post dyn · coronal · 3.0mm · 0.62mm/px · 1 of 9 slices shown (2 of 6)]
[im 1/9]
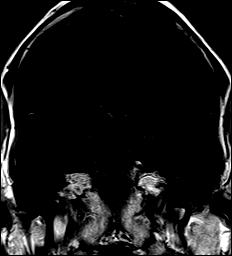

[Series 17: cor post dyn · coronal · 3.0mm · 0.62mm/px · 1 of 9 slices shown (3 of 6)]
[im 1/9]
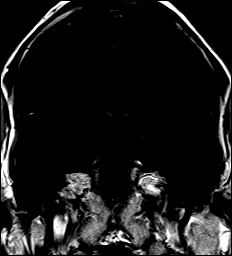

[Series 18: cor post dyn · coronal · 3.0mm · 0.62mm/px · 1 of 9 slices shown (4 of 6)]
[im 1/9]
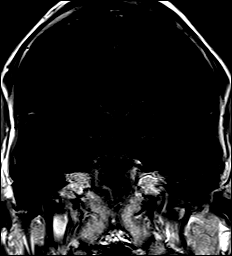

[Series 19: cor post dyn · coronal · 3.0mm · 0.62mm/px · 1 of 9 slices shown (5 of 6)]
[im 1/9]
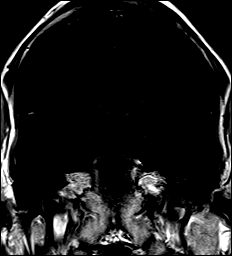

[Series 20: cor post dyn · coronal · 3.0mm · 0.62mm/px · 1 of 9 slices shown (6 of 6)]
[im 1/9]
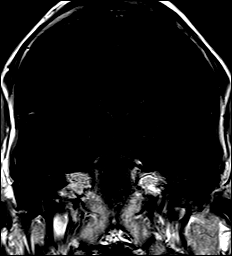

[Series 21: T1 post-contrast · sagittal · 3.0mm · 0.42mm/px · 1 of 13 slices shown (1 of 3)]
[im 1/13]
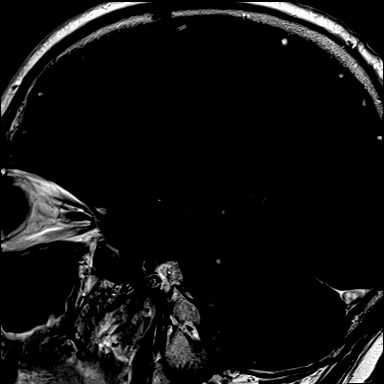

[Series 22: T1 post-contrast · coronal · 3.0mm · 0.42mm/px · 1 of 10 slices shown (2 of 3)]
[im 1/10]
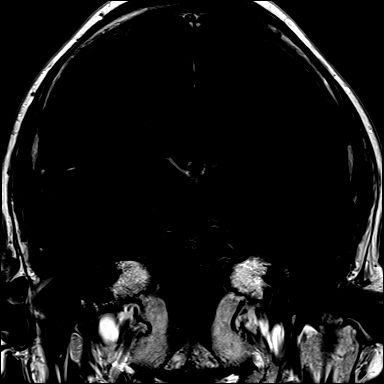

[Series 23: T1 · axial · 1.0mm · 0.86mm/px · z∈[-45,+124]mm · 13 of 176 slices shown (5 of 5)]
[im 1/176]
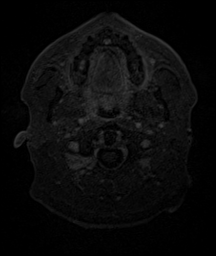
[im 15/176]
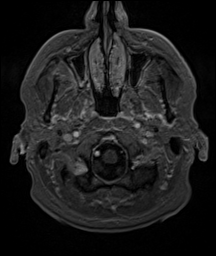
[im 30/176]
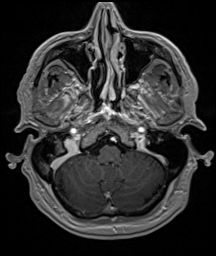
[im 44/176]
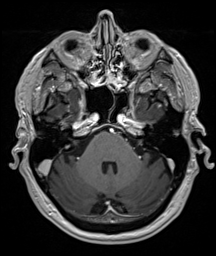
[im 59/176]
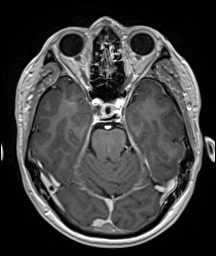
[im 73/176]
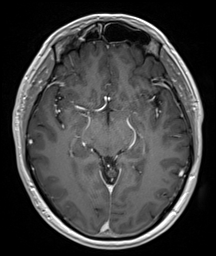
[im 88/176]
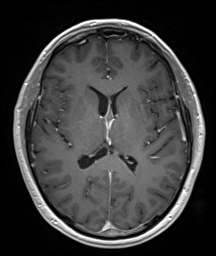
[im 103/176]
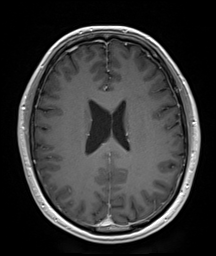
[im 117/176]
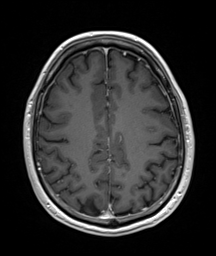
[im 132/176]
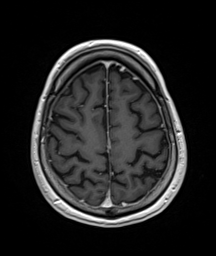
[im 146/176]
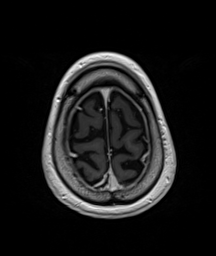
[im 161/176]
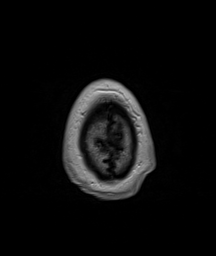
[im 176/176]
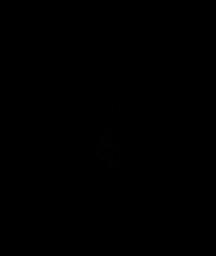

[Series 24: T1 post-contrast · coronal · 4.0mm · 0.47mm/px · 3 of 38 slices shown (3 of 3)]
[im 1/38]
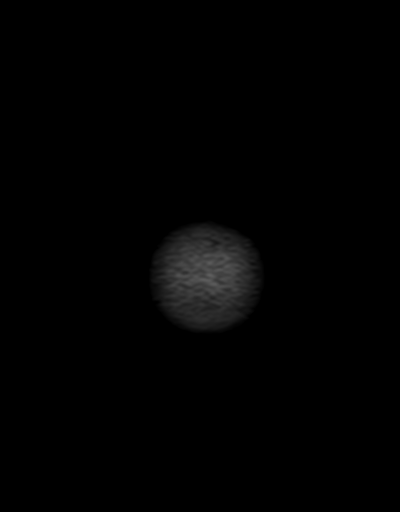
[im 19/38]
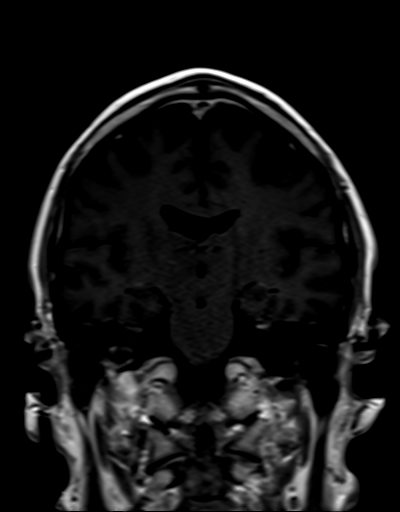
[im 38/38]
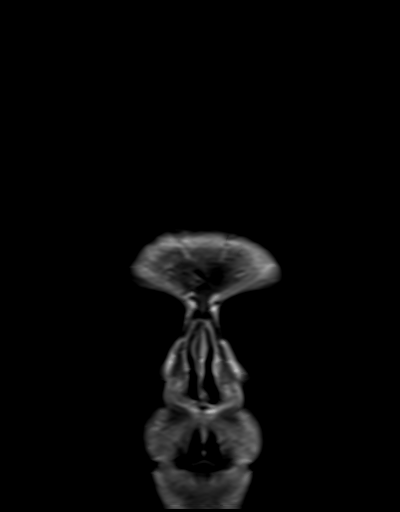

[48 of 48 positions shown; findings below may reference images not displayed]

FINDINGS: Brain: No acute infarction, hemorrhage, hydrocephalus or extra-axial
collection.

A few scattered foci of T2 hyperintensity are seen within the white
matter of the cerebral hemispheres, nonspecific. Mild parenchymal
volume loss.

Pituitary/Sella: A T1 hypointense, nonenhancing lesion is seen
within the right side of the sella and extending into the right
cavernous sinus, measuring approximately 11 x 8 x 7 mm. No extension
into the suprasellar cistern. Normal pituitary gland is mildly
deviated to the left. The pituitary stalk has normal thickness and
is mildly deviated to the left. The hypothalamus and mamillary
bodies are normal. There is no mass effect on the optic chiasm or
optic nerves. The infundibular and chiasmatic recesses are clear.
Normal cavernous internal carotid artery flow voids.

Vascular: Normal flow voids.

Skull and upper cervical spine: Normal marrow signal.

Sinuses/Orbits: Mucosal thickening throughout the paranasal sinuses,
more pronounced in the ethmoid cells. The orbits are maintained.

Other: None.
IMPRESSION: 1. A 11 x 8 x 7 mm lesion within the right side of the sella and
extending into the right cavernous sinus, consistent with a
pituitary adenoma. No extension into the suprasellar cistern.
2. A few scattered foci of T2 hyperintensity within the white matter
of the cerebral hemispheres, nonspecific. This may represent
microvascular ischemic changes.
3. Paranasal sinus disease, more pronounced in the ethmoid cells.

## 2022-10-25 ENCOUNTER — Ambulatory Visit: Admit: 2022-10-25 | Payer: PPO | Admitting: Internal Medicine

## 2022-10-25 SURGERY — COLONOSCOPY
Anesthesia: General

## 2022-11-15 ENCOUNTER — Ambulatory Visit: Payer: PPO

## 2022-11-15 DIAGNOSIS — Z860101 Personal history of adenomatous and serrated colon polyps: Secondary | ICD-10-CM | POA: Diagnosis not present

## 2022-11-15 DIAGNOSIS — Z860100 Personal history of colon polyps, unspecified: Secondary | ICD-10-CM | POA: Diagnosis not present

## 2022-11-15 DIAGNOSIS — K2270 Barrett's esophagus without dysplasia: Secondary | ICD-10-CM | POA: Diagnosis not present

## 2022-11-15 DIAGNOSIS — K640 First degree hemorrhoids: Secondary | ICD-10-CM | POA: Diagnosis not present

## 2022-11-15 DIAGNOSIS — Z1211 Encounter for screening for malignant neoplasm of colon: Secondary | ICD-10-CM | POA: Diagnosis not present

## 2022-11-15 DIAGNOSIS — Z09 Encounter for follow-up examination after completed treatment for conditions other than malignant neoplasm: Secondary | ICD-10-CM | POA: Diagnosis not present

## 2022-11-15 DIAGNOSIS — K2970 Gastritis, unspecified, without bleeding: Secondary | ICD-10-CM | POA: Diagnosis not present

## 2022-11-29 ENCOUNTER — Encounter: Payer: Self-pay | Admitting: Internal Medicine

## 2022-11-29 ENCOUNTER — Ambulatory Visit: Payer: PPO | Admitting: Internal Medicine

## 2022-11-29 VITALS — BP 138/70 | HR 67 | Ht 71.0 in | Wt 174.2 lb

## 2022-11-29 DIAGNOSIS — E221 Hyperprolactinemia: Secondary | ICD-10-CM

## 2022-11-29 DIAGNOSIS — D497 Neoplasm of unspecified behavior of endocrine glands and other parts of nervous system: Secondary | ICD-10-CM

## 2022-11-29 NOTE — Patient Instructions (Signed)
Please stop at the lab.  Continue Cabergoline 0.25 mg every other week.  Please come back for a follow-up appointment in 1 year.

## 2022-11-29 NOTE — Progress Notes (Unsigned)
Patient ID: Zachary Hayden, male   DOB: 17-Mar-1950, 72 y.o.   MRN: [id number]  HPI  Zachary Hayden is a 72 y.o.-year-old male, initially referred by his PCP, Mayford Knife Cecille Amsterdam, MD, for management of a pituitary tumor, hyperprolactinemia, and hypogonadotropic hypogonadism. Last visit 2 years ago.  Interim history: At today's visit, he feels well, without complaints.   No headaches, vision problems, increased fatigue, breast tenderness, or galactorrhea.  Reviewed patient's pituitary tumor history: Pt was dx'ed with hyperprolactinemia since 2000. He was first diagnosed  On imaging in 2005 when his prolactin was 960, and pituitary MRI showed a 15 x 15  x 20 mm lesion. He has had good suppression of his prolactin with bromocriptine, but when it was tried to be tapered off after 3 years 06/2009, his prolactin levels started to rise in normal range. Otherwise it has been well suppressed . He was on bromocriptine 2.5 twice daily, no problems with fatigue,nausea, LH, or headaches. No breast discharge. Has stable mild enlargement of the breasts bilaterally without masses.  Additionally, he had hypogonadism when the prolactin was high, which initially recovered when the prolactin was suppressed, but recurred despite PRL being normal. He also developed prostate cancer treated with prostatectomy.  He had low libido, mild erectile dysfunction. In the past, he has noticed the significant decrease in libido when his prolactin has risen and testosterone has declined.  He had occasional hot flashes but no muscle weakness.  He continues on testosterone gel per urology.  He was initially on 2 pumps daily, then on 1 pump daily with good results in terms of improved erections and libido. He gets his testosterone tested 2x a year.  He was previously on Cabergoline for approximately a year but because of price, ~4 years ago, tried Bromocriptine.  He also had a trial of bromocriptine 2016, however, his prolactin  increased.  He was able to restart Cabergoline 02/2014.  He was then on Cabergoline 0.25 mg weekly since 09/2016.    We decreased the Cabergoline dose to 0.25 mg every other week starting 11/2021. He tolerates this well, without dizziness, headaches, nausea, congestion.  Labs and imaging reports reviewed: Since last visit, we checked another pituitary MRI (12/21/2019): His pituitary adenoma decreased in size: Pituitary/Sella: A T1 hypointense, nonenhancing lesion is seen within the right side of the sella and extending into the right cavernous sinus, measuring approximately 11 x 8 x 7 mm. No extension into the suprasellar cistern. Normal pituitary gland is mildly deviated to the left. The pituitary stalk has normal thickness and is mildly deviated to the left. The hypothalamus and mamillary bodies are normal. There is no mass effect on the optic chiasm or optic nerves. The infundibular and chiasmatic recesses are clear.   IMPRESSION: 1. A 11 x 8 x 7 mm lesion within the right side of the sella and extending into the right cavernous sinus, consistent with a pituitary adenoma. No extension into the suprasellar cistern. 2. A few scattered foci of T2 hyperintensity within the white matter of the cerebral hemispheres, nonspecific. This may represent microvascular ischemic changes. 3. Paranasal sinus disease, more pronounced in the ethmoid cells.  Reviewed prolactin levels: Lab Results  Component Value Date   PROLACTIN 6.5 01/15/2022   PROLACTIN 3.4 11/28/2021   PROLACTIN 6.6 11/24/2020   PROLACTIN 5.4 11/25/2019   PROLACTIN 5.8 10/14/2018   PROLACTIN 5.5 10/10/2017   PROLACTIN 5.1 02/19/2017   PROLACTIN 4.4 10/10/2016   PROLACTIN 5.9 10/12/2015  PROLACTIN 4.3 12/27/2014   PROLACTIN 2.4 10/11/2014   PROLACTIN 3.6 07/07/2014   Testosterone level was low: Component     Latest Ref Rng & Units 02/19/2017  Testosterone     264 - 916 ng/dL 92 (L)  Testosterone Free     6.6 - 18.1  pg/mL 2.0 (L)  Sex Horm Binding Glob, Serum     19.3 - 76.4 nmol/L 118.1 (H)   CBC normal: Lab Results  Component Value Date   WBC 6.8 02/19/2017   HGB 13.9 02/19/2017   HCT 41.2 02/19/2017   MCV 88.3 02/19/2017   PLT 214.0 02/19/2017  No results found for: "PSA"  Previously: 11/1998 - PRL 548.8, TSH 1.91, T4 8.6 2001- PRL 660 01/2003 - FSH<0.3, LH <0.3, PRL 939 02/2003 - MRI [facility]- enlarged pituitary gland slight to right of midline, homogenous enhancement post contrast, no stalk deviation, no impingement on optic chiasm, no narrowing of cavernous sinus, ICA; tumor 1.5 x1.5 x2cm, enhancing nodule within pituitary as well 04/2003- TSH 2.69, free T4 0.95, PRL 862, cortisol 13.4, Testosterone 298 ( 179-756), IGF-1 178 ( 90-360) 11/2003 - Testosterone low at 20- was on Androgel at that time 01/2004 - PRL 9 04/2004 - PRL 6, Testosterone 354 01/2005 - MRI: 12mm pituitary mass, decreased T1 signal , increased T2 and hypoenhancement right aspect of sella, displacing pituitary to left 02/2005- PRL 3, Testosterone 257 02/2006-PRL 5, Testosterone 318 03/2008 PRL 3, Testosterone 278 02/2009- PRL 5, Testosterone 310>>dose reduced to bromocriptine 2.5 mg daily 05/2009 -PRL 7, Testos 271 10/2009-PRL 10 03/2010 -PRL 9>>dose increased to 5mg  daily 03/2011-PRL 3, Testos 330 01/2012 - MRI pituitary , [facility]- compared to 2010: 1.5 x 1.1 cm enhacing mass in the right sella involving right cavernous sinus and displacing pitiutary stalk to left (grossly unchanged appearance as compared to prior) 02/2012-PRL 6, Testosterone 329 2015 - VF normal. 04/19/2014 MRI: stable prolactinoma within right side of pituitary ( 1.5 x1.2 x1.2 cm) compared to 2013 scan. There was mild left deviation of the pituitary stalk, unchanged.  03/2014: Testosterone: 205 (179-756), am, fasting 06/2015: nonfasting: t Testosterone: 174 (before the weight loss) 10/12/2015: Total testosterone 190 (264-916), SHBG 122.6 (19.3-76.4), free  testosterone 2.1 (6.6-18.1)  07/2016: nonfasting: t testosterone 144; estradiol 46.2 at urologist office, PSA 0.0 07/2019: Testosterone reportedly 273 07/2020: Testosterone reportedly 299  We initially started Clomid (with Urology consent): 25 mg daily.  He developed more hot flashes on this and no increased libido or improvement in erections.  Therefore, we stopped Clomid and I again suggested to start testosterone replacement.  This is now managed by urology per patient's preference (Dr. Evelene Croon).  He is now on compounded testosterone-5 g daily ([facility] in [place]).   He also has a history of HL, GERD. On Biotin 30 mcg daily.  ROS: + See HPI  I reviewed pt's medications, allergies, PMH, social hx, family hx, and changes were documented in the history of present illness. Otherwise, unchanged from my initial visit note.  Past Medical History:  Diagnosis Date   Allergy    Basal cell carcinoma 08/27/2018   Right top of shoulder. Superficial and nodular patterns.   Cancer [facility]) 2005   prostate   Colon polyp 2012   3 polyps   GERD (gastroesophageal reflux disease)    Hyperlipidemia    Internal hemorrhoids    Pituitary adenoma (HCC) 1992   Prostate cancer (HCC) 2005   Past Surgical History:  Procedure Laterality Date   COLONOSCOPY  2010, 2012, 2014   Dr. Niel Hummer   COLONOSCOPY WITH PROPOFOL N/A 04/09/2017   Procedure: COLONOSCOPY WITH PROPOFOL;  Surgeon: Toledo, Boykin Nearing, MD;  Location: [facility] ENDOSCOPY;  Service: Gastroenterology;  Laterality: N/A;   ESOPHAGOGASTRODUODENOSCOPY (EGD) WITH PROPOFOL N/A 04/09/2017   Procedure: ESOPHAGOGASTRODUODENOSCOPY (EGD) WITH PROPOFOL;  Surgeon: Toledo, Boykin Nearing, MD;  Location: [facility] ENDOSCOPY;  Service: Gastroenterology;  Laterality: N/A;   PROSTATE SURGERY  2005   radical   PROSTATECTOMY     Social History   Social History   Marital Status: Married    Spouse Name: N/A   Number of Children: 4   Occupational History     Pastor   Social History Main Topics   Smoking status: Never Smoker    Smokeless tobacco: Never Used   Alcohol Use: 0.0 oz/week    0 Standard drinks or equivalent per week     Comment: glass of wine monthly    Drug Use: No   Current Outpatient Medications on File Prior to Visit  Medication Sig Dispense Refill   Acetaminophen 500 MG capsule Take by mouth.     Arginine 1000 MG TABS Take by mouth.     cabergoline (DOSTINEX) 0.5 MG tablet TAKE 1/2 TABLET ONCE EVERY 2  WEEKS 5 tablet 3   CALCIUM CITRATE PO Take by mouth.     Cholecalciferol (VITAMIN D3 PO) Take by mouth.     Coenzyme Q10 (COQ-10 PO) Take by mouth.     Cyanocobalamin 5000 MCG/ML LIQD Place under the tongue.     diphenhydrAMINE (BENADRYL) 25 MG tablet Take 25 mg by mouth every 6 (six) hours as needed.     famotidine (PEPCID) 20 MG tablet Take 20 mg by mouth every other day.      Ginkgo Biloba Extract 60 MG CAPS Take by mouth.     Krill Oil 1000 MG CAPS Take by mouth.     MAGNESIUM PO Take by mouth.     Melatonin 10 MG TABS Take by mouth.     Multiple Vitamin (MULTIVITAMIN) tablet Take 1 tablet by mouth daily.     Multiple Vitamins-Minerals (ZINC PO) Take by mouth.     NATTOKINASE PO Take by mouth.     niacin 100 MG tablet Take 100 mg by mouth at bedtime.     omeprazole (PRILOSEC OTC) 20 MG tablet Take 20 mg by mouth every other day.      pravastatin (PRAVACHOL) 20 MG tablet Take 20 mg by mouth at bedtime. (Patient not taking: Reported on 08/15/2021)     Probiotic Product (PROBIOTIC DAILY PO) Take by mouth.     PSYLLIUM PO Take by mouth.     Pyridoxine HCl (VITAMIN B6 PO) Take by mouth.     QUERCETIN PO Take by mouth.     Red Yeast Rice Extract (RED YEAST RICE PO) Take by mouth.     sildenafil (VIAGRA) 100 MG tablet Take 100 mg by mouth as needed for erectile dysfunction.     Testosterone 20 % CREA by Does not apply route.     vitamin C (ASCORBIC ACID) 500 MG tablet Take 500 mg by mouth daily.     VITAMIN D,  CHOLECALCIFEROL, PO Take by mouth.     vitamin k 100 MCG tablet Take 100 mcg by mouth daily.     No current facility-administered medications on file prior to visit.   No Known Allergies Family History  Problem Relation Age of Onset   Other Cousin  Heart disease Father    Hyperlipidemia Father    Hypertension Father    PE: There were no vitals taken for this visit.  Wt Readings from Last 3 Encounters:  11/28/21 187 lb 9.6 oz (85.1 kg)  11/24/20 184 lb 12.8 oz (83.8 kg)  11/25/19 187 lb 9.6 oz (85.1 kg)   Constitutional: overweight, in NAD Eyes:  EOMI, no exophthalmos ENT: no neck masses, no cervical lymphadenopathy Cardiovascular: RRR, No MRG Respiratory: CTA B Musculoskeletal: no deformities Skin:no rashes Neurological: no tremor with outstretched hands  ASSESSMENT: 1. Pituitary tumor  2.  Hyperprolactinemia  3. Hypogonadotropic hypogonadism  PLAN:  1.  Patient with history of pituitary tumor diagnosed in 2000.  This has decreased in size after starting treatment with bromocriptine and was stable, at 1.5 cm, on his pituitary MRI from 04/19/2014.  However, afterwards, he started Cabergoline and on the latest pituitary MRI from 2021, but the pituitary tumor size previously 1.1 cm-is a good result. -Since his adenoma is not in close proximity to the optic chiasm, I do not feel that we need to repeat visual fields.  The latest was from 2016: Normal. -He denies headaches or visual disturbance -At last visit with discussed about repeating his pituitary MRI to see if the tumor shrunk further.  He wanted to postpone this for a year. At today's visit, he would like to postpone the MRI again. We discussed that we would need this if we planned to stop the med, but since we are continuing for now, we can continue without repeat imaging for now, especially as he does not have any tumor compression symptoms. -We will see him back in 1 year  2. Hyperprolactinemia -Likely related to his  pituitary tumor (prolactinoma) -He was initially on bromocriptine due to price.  He tried to come off dopamine agonist in the past but prolactin level increased. -He is currently on Cabergoline, which he tolerates well, without side effects -At last visit, as his prolactin level was excellent, I suggested to decrease the dose of Cabergoline from 0.25 mg weekly to every other week -On the above dose, a repeat Cabergoline level approximately 2 months later was stable, normal -We discussed that with smaller pituitary tumors, we may consider Cabergoline holidays, but not usually for macroadenomas -At today's visit we will recheck his prolactin level  3. Hypogonadotropic hypogonadism -Now managed by urology - Patient has had low testosterone levels due to his high prolactin levels in the past and the levels remained low despite efficacy of the prolactinoma treatment.  He did complain of low libido and problems with erections and he was interested in starting treatment for his low testosterone level.  We discussed  that we have to be very careful with this due to previous history of prostate cancer.  Of note, his urologist gave [place] the ok to use testosterone supplement. Since he also had a high estrogen level at the time at his visit with a urologist in 07/2016, I suggested to try Clomid first.  We started this in 09/2016.  He noticed more hot flashes but no perceived improvement in libido and erections - Reviewed previous testosterone levels: testosterone level was low (total testosterone 144), but he was not fasting at the time of his last visit with his urologist (he usually sees him in June of every year); we repeated his labs on 10/10/2016, fasting, and the testosterone was still low (total testosterone 158, free testosterone 2.3).  Estrogen level has been normal (10/10/2016: Free estradiol level  11, normal <29).  Reportedly his most recent testosterone from 07/2020 was normal at 299. -I suggested  testosterone treatment and he was cleared for this by his urologist (Dr. Sheppard Penton).  He started the patient on compounded testosterone cream (cheaper for him) - doing great on this  Needs refills.  Carlus Pavlov, MD PhD [facility]

## 2022-11-30 LAB — PROLACTIN: Prolactin: 5 ng/mL (ref 2.0–18.0)

## 2022-12-02 MED ORDER — CABERGOLINE 0.5 MG PO TABS: ORAL_TABLET | ORAL | 3 refills | Status: DC

## 2022-12-26 ENCOUNTER — Ambulatory Visit: Payer: PPO | Admitting: Dermatology

## 2022-12-26 DIAGNOSIS — Z1283 Encounter for screening for malignant neoplasm of skin: Secondary | ICD-10-CM | POA: Diagnosis not present

## 2022-12-26 DIAGNOSIS — Z872 Personal history of diseases of the skin and subcutaneous tissue: Secondary | ICD-10-CM

## 2022-12-26 DIAGNOSIS — Z85828 Personal history of other malignant neoplasm of skin: Secondary | ICD-10-CM | POA: Diagnosis not present

## 2022-12-26 DIAGNOSIS — L814 Other melanin hyperpigmentation: Secondary | ICD-10-CM

## 2022-12-26 DIAGNOSIS — L918 Other hypertrophic disorders of the skin: Secondary | ICD-10-CM

## 2022-12-26 DIAGNOSIS — L570 Actinic keratosis: Secondary | ICD-10-CM | POA: Diagnosis not present

## 2022-12-26 DIAGNOSIS — W908XXA Exposure to other nonionizing radiation, initial encounter: Secondary | ICD-10-CM | POA: Diagnosis not present

## 2022-12-26 DIAGNOSIS — L820 Inflamed seborrheic keratosis: Secondary | ICD-10-CM | POA: Diagnosis not present

## 2022-12-26 DIAGNOSIS — L821 Other seborrheic keratosis: Secondary | ICD-10-CM

## 2022-12-26 DIAGNOSIS — D1722 Benign lipomatous neoplasm of skin and subcutaneous tissue of left arm: Secondary | ICD-10-CM

## 2022-12-26 DIAGNOSIS — L578 Other skin changes due to chronic exposure to nonionizing radiation: Secondary | ICD-10-CM

## 2022-12-26 DIAGNOSIS — D1801 Hemangioma of skin and subcutaneous tissue: Secondary | ICD-10-CM

## 2022-12-26 DIAGNOSIS — D229 Melanocytic nevi, unspecified: Secondary | ICD-10-CM

## 2022-12-26 NOTE — Patient Instructions (Addendum)

## 2022-12-26 NOTE — Progress Notes (Signed)
Follow-Up Visit   Subjective  Zachary Hayden is a 72 y.o. male who presents for the following: Skin Cancer Screening and Full Body Skin Exam Hx of bcc, hx of aks, hx of isks  Patient reports some spots at left temple, left ear and spot at left upper arm.   The patient presents for Total-Body Skin Exam (TBSE) for skin cancer screening and mole check. The patient has spots, moles and lesions to be evaluated, some may be new or changing and the patient may have concern these could be cancer.    The following portions of the chart were reviewed this encounter and updated as appropriate: medications, allergies, medical history  Review of Systems:  No other skin or systemic complaints except as noted in HPI or Assessment and Plan.  Objective  Well appearing patient in no apparent distress; mood and affect are within normal limits.  A full examination was performed including scalp, head, eyes, ears, nose, lips, neck, chest, axillae, abdomen, back, buttocks, bilateral upper extremities, bilateral lower extremities, hands, feet, fingers, toes, fingernails, and toenails. All findings within normal limits unless otherwise noted below.   Relevant physical exam findings are noted in the Assessment and Plan.  left temple and left ear x 2 (2) Erythematous thin papules/macules with gritty scale.   right face and left temple x 3 (3) Erythematous stuck-on, waxy papule or plaque    Assessment & Plan   SKIN CANCER SCREENING PERFORMED TODAY.  Lipoma  Exam: Subcutaneous rubbery nodule(s) Location: left upper arm   Benign-appearing. Exam most consistent with a lipoma. Discussed that a lipoma is a benign fatty growth that can grow over time and sometimes get irritated. Recommend observation if it is not bothersome or changing. Discussed option of ILK injections or surgical excision to remove it if it is growing, symptomatic, or other changes noted. Please call for new or changing lesions so they  can be evaluated.   ACTINIC DAMAGE - Chronic condition, secondary to cumulative UV/sun exposure - diffuse scaly erythematous macules with underlying dyspigmentation - Recommend daily broad spectrum sunscreen SPF 30+ to sun-exposed areas, reapply every 2 hours as needed.  - Staying in the shade or wearing long sleeves, sun glasses (UVA+UVB protection) and wide brim hats (4-inch brim around the entire circumference of the hat) are also recommended for sun protection.  - Call for new or changing lesions.  LENTIGINES, SEBORRHEIC KERATOSES, HEMANGIOMAS - Benign normal skin lesions - Benign-appearing - Call for any changes  MELANOCYTIC NEVI - Tan-brown and/or pink-flesh-colored symmetric macules and papules - Benign appearing on exam today - Observation - Call clinic for new or changing moles - Recommend daily use of broad spectrum spf 30+ sunscreen to sun-exposed areas.    Acrochordons (Skin Tags) - Fleshy, skin-colored pedunculated papules - Benign appearing.  - Observe. - If desired, they can be removed with an in office procedure that is not covered by insurance. - Please call the clinic if you notice any new or changing lesions.   HISTORY OF BASAL CELL CARCINOMA OF THE SKIN Right top of shoulder 08/2018 - No evidence of recurrence today - Recommend regular full body skin exams - Recommend daily broad spectrum sunscreen SPF 30+ to sun-exposed areas, reapply every 2 hours as needed.  - Call if any new or changing lesions are noted between office visits   Actinic keratosis (2) left temple and left ear x 2  Actinic keratoses are precancerous spots that appear secondary to cumulative UV radiation  exposure/sun exposure over time. They are chronic with expected duration over 1 year. A portion of actinic keratoses will progress to squamous cell carcinoma of the skin. It is not possible to reliably predict which spots will progress to skin cancer and so treatment is recommended to  prevent development of skin cancer.  Recommend daily broad spectrum sunscreen SPF 30+ to sun-exposed areas, reapply every 2 hours as needed.  Recommend staying in the shade or wearing long sleeves, sun glasses (UVA+UVB protection) and wide brim hats (4-inch brim around the entire circumference of the hat). Call for new or changing lesions.  Destruction of lesion - left temple and left ear x 2 (2) Complexity: simple   Destruction method: cryotherapy   Informed consent: discussed and consent obtained   Timeout:  patient name, date of birth, surgical site, and procedure verified Lesion destroyed using liquid nitrogen: Yes   Region frozen until ice ball extended beyond lesion: Yes   Outcome: patient tolerated procedure well with no complications   Post-procedure details: wound care instructions given    Inflamed seborrheic keratosis (3) right face and left temple x 3  Symptomatic, irritating, patient would like treated.  Destruction of lesion - right face and left temple x 3 (3) Complexity: simple   Destruction method: cryotherapy   Informed consent: discussed and consent obtained   Timeout:  patient name, date of birth, surgical site, and procedure verified Lesion destroyed using liquid nitrogen: Yes   Region frozen until ice ball extended beyond lesion: Yes   Outcome: patient tolerated procedure well with no complications   Post-procedure details: wound care instructions given     Return in about 1 year (around 12/26/2023) for TBSE.  IAsher Muir, CMA, am acting as scribe for Armida Sans, MD.   Documentation: I have reviewed the above documentation for accuracy and completeness, and I agree with the above.  Armida Sans, MD

## 2023-01-04 ENCOUNTER — Encounter: Payer: Self-pay | Admitting: Dermatology

## 2023-09-23 ENCOUNTER — Encounter: Payer: Self-pay | Admitting: Internal Medicine

## 2023-11-28 ENCOUNTER — Ambulatory Visit: Payer: PPO | Admitting: Internal Medicine

## 2023-11-28 ENCOUNTER — Encounter: Payer: Self-pay | Admitting: Internal Medicine

## 2023-11-28 ENCOUNTER — Other Ambulatory Visit

## 2023-11-28 VITALS — BP 138/78 | HR 66 | Ht 71.0 in | Wt 180.8 lb

## 2023-11-28 DIAGNOSIS — E221 Hyperprolactinemia: Secondary | ICD-10-CM | POA: Diagnosis not present

## 2023-11-28 DIAGNOSIS — D497 Neoplasm of unspecified behavior of endocrine glands and other parts of nervous system: Secondary | ICD-10-CM | POA: Diagnosis not present

## 2023-11-28 NOTE — Patient Instructions (Signed)
Please stop at the lab.  Continue Cabergoline 0.25 mg every other week.  Please come back for a follow-up appointment in 1 year.

## 2023-11-28 NOTE — Progress Notes (Addendum)
 Patient ID: Zachary Hayden, male   DOB: 08-May-1950, 73 y.o.   MRN: [id number]  HPI  Zachary Hayden is a 73 y.o.-year-old male, initially referred by his PCP, Trudy Dorn BRAVO, MD, for management of a pituitary tumor, hyperprolactinemia, and hypogonadotropic hypogonadism. Last visit 1 year ago.  Interim history: At today's visit, he feels well, without complaints.   No headaches, double vision or visual field cuts, increased fatigue, breast tenderness, or galactorrhea.  Reviewed patient's pituitary tumor history: Pt was dx'ed with hyperprolactinemia since 2000. He was first diagnosed  On imaging in 2005 when his prolactin was 960, and pituitary MRI showed a 15 x 15  x 20 mm lesion. He has had good suppression of his prolactin with bromocriptine , but when it was tried to be tapered off after 3 years 06/2009, his prolactin levels started to rise in normal range. Otherwise it has been well suppressed . He was on bromocriptine  2.5 twice daily, no problems with fatigue,nausea, LH, or headaches. No breast discharge. Has stable mild enlargement of the breasts bilaterally without masses.  Additionally, he had hypogonadism when the prolactin was high, which initially recovered when the prolactin was suppressed, but recurred despite PRL being normal. He also developed prostate cancer treated with prostatectomy.  He had low libido, mild erectile dysfunction. In the past, he has noticed the significant decrease in libido when his prolactin has risen and testosterone  has declined.  He had occasional hot flashes but no muscle weakness.  He continues on testosterone  gel per urology.  He was initially on 2 pumps daily, then on 1 pump daily with good results in terms of improved erections and libido. He gets his testosterone  tested 2x a year.  He was previously on Cabergoline  for approximately a year but because of price, ~4 years ago, tried Bromocriptine .  He also had a trial of bromocriptine  2016, however, his  prolactin increased.  He was able to restart Cabergoline  02/2014.  He was then on Cabergoline  0.25 mg weekly since 09/2016.    We decreased the Cabergoline  dose to 0.25 mg every other week starting 11/2021. He tolerates this well, without dizziness, headaches, nausea, congestion.  Labs and imaging reports reviewed: Since last visit, we checked another pituitary MRI (12/21/2019): His pituitary adenoma decreased in size: Pituitary/Sella: A T1 hypointense, nonenhancing lesion is seen within the right side of the sella and extending into the right cavernous sinus, measuring approximately 11 x 8 x 7 mm. No extension into the suprasellar cistern. Normal pituitary gland is mildly deviated to the left. The pituitary stalk has normal thickness and is mildly deviated to the left. The hypothalamus and mamillary bodies are normal. There is no mass effect on the optic chiasm or optic nerves. The infundibular and chiasmatic recesses are clear.   IMPRESSION: 1. A 11 x 8 x 7 mm lesion within the right side of the sella and extending into the right cavernous sinus, consistent with a pituitary adenoma. No extension into the suprasellar cistern. 2. A few scattered foci of T2 hyperintensity within the white matter of the cerebral hemispheres, nonspecific. This may represent microvascular ischemic changes. 3. Paranasal sinus disease, more pronounced in the ethmoid cells.  Reviewed prolactin levels: Lab Results  Component Value Date   PROLACTIN 5.0 11/29/2022   PROLACTIN 6.5 01/15/2022   PROLACTIN 3.4 11/28/2021   PROLACTIN 6.6 11/24/2020   PROLACTIN 5.4 11/25/2019   PROLACTIN 5.8 10/14/2018   PROLACTIN 5.5 10/10/2017   PROLACTIN 5.1 02/19/2017   PROLACTIN  4.4 10/10/2016   PROLACTIN 5.9 10/12/2015   PROLACTIN 4.3 12/27/2014   PROLACTIN 2.4 10/11/2014   PROLACTIN 3.6 07/07/2014   Testosterone  level was low: Component     Latest Ref Rng & Units 02/19/2017  Testosterone      264 - 916 ng/dL 92 (L)   Testosterone  Free     6.6 - 18.1 pg/mL 2.0 (L)  Sex Horm Binding Glob, Serum     19.3 - 76.4 nmol/L 118.1 (H)   CBC normal: Lab Results  Component Value Date   WBC 6.8 02/19/2017   HGB 13.9 02/19/2017   HCT 41.2 02/19/2017   MCV 88.3 02/19/2017   PLT 214.0 02/19/2017  No results found for: PSA  Previously: 11/1998 - PRL 548.8, TSH 1.91, T4 8.6 2001- PRL 660 01/2003 - FSH<0.3, LH <0.3, PRL 939 02/2003 - MRI [facility]- enlarged pituitary gland slight to right of midline, homogenous enhancement post contrast, no stalk deviation, no impingement on optic chiasm, no narrowing of cavernous sinus, ICA; tumor 1.5 x1.5 x2cm, enhancing nodule within pituitary as well 04/2003- TSH 2.69, free T4 0.95, PRL 862, cortisol 13.4, Testosterone  298 ( 179-756), IGF-1 178 ( 90-360) 11/2003 - Testosterone  low at 20- was on Androgel  at that time 01/2004 - PRL 9 04/2004 - PRL 6, Testosterone  354 01/2005 - MRI: 12mm pituitary mass, decreased T1 signal , increased T2 and hypoenhancement right aspect of sella, displacing pituitary to left 02/2005- PRL 3, Testosterone  257 02/2006-PRL 5, Testosterone  318 03/2008 PRL 3, Testosterone  278 02/2009- PRL 5, Testosterone  310>>dose reduced to bromocriptine  2.5 mg daily 05/2009 -PRL 7, Testos 271 10/2009-PRL 10 03/2010 -PRL 9>>dose increased to 5mg  daily 03/2011-PRL 3, Testos 330 01/2012 - MRI pituitary , [facility]- compared to 2010: 1.5 x 1.1 cm enhacing mass in the right sella involving right cavernous sinus and displacing pitiutary stalk to left (grossly unchanged appearance as compared to prior) 02/2012-PRL 6, Testosterone  329 2015 - VF normal. 04/19/2014 MRI: stable prolactinoma within right side of pituitary ( 1.5 x1.2 x1.2 cm) compared to 2013 scan. There was mild left deviation of the pituitary stalk, unchanged.  03/2014: Testosterone : 205 (179-756), am, fasting 06/2015: nonfasting: t Testosterone : 174 (before the weight loss) 10/12/2015: Total testosterone  190 (264-916),  SHBG 122.6 (19.3-76.4), free testosterone  2.1 (6.6-18.1)  07/2016: nonfasting: t testosterone  144; estradiol  46.2 at urologist office, PSA 0.0 07/2019: Testosterone  reportedly 273 07/2020: Testosterone  reportedly 299  We initially started Clomid  (with Urology consent): 25 mg daily.  He developed more hot flashes on this and no increased libido or improvement in erections.  Therefore, we stopped Clomid  and I again suggested to start testosterone  replacement.  This is now managed by urology per patient's preference (Dr. Kassie).  He is now on compounded testosterone -5 g daily ([facility] in [place]).   He also has a history of HL, GERD. On Biotin 30 mcg daily.  ROS: + See HPI  I reviewed pt's medications, allergies, PMH, social hx, family hx, and changes were documented in the history of present illness. Otherwise, unchanged from my initial visit note.  Past Medical History:  Diagnosis Date   Allergy    Basal cell carcinoma 08/27/2018   Right top of shoulder. Superficial and nodular patterns.   Cancer [facility]) 2005   prostate   Colon polyp 2012   3 polyps   GERD (gastroesophageal reflux disease)    Hyperlipidemia    Internal hemorrhoids    Pituitary adenoma (HCC) 1992   Prostate cancer (HCC) 2005   Past Surgical  History:  Procedure Laterality Date   COLONOSCOPY  2010, 2012, 2014   Dr. Christ   COLONOSCOPY WITH PROPOFOL  N/A 04/09/2017   Procedure: COLONOSCOPY WITH PROPOFOL ;  Surgeon: Toledo, Ladell POUR, MD;  Location: [facility] ENDOSCOPY;  Service: Gastroenterology;  Laterality: N/A;   ESOPHAGOGASTRODUODENOSCOPY (EGD) WITH PROPOFOL  N/A 04/09/2017   Procedure: ESOPHAGOGASTRODUODENOSCOPY (EGD) WITH PROPOFOL ;  Surgeon: Toledo, Ladell POUR, MD;  Location: [facility] ENDOSCOPY;  Service: Gastroenterology;  Laterality: N/A;   PROSTATE SURGERY  2005   radical   PROSTATECTOMY     Social History   Social History   Marital Status: Married    Spouse Name: N/A   Number of Children: 4    Occupational History    Pastor   Social History Main Topics   Smoking status: Never Smoker    Smokeless tobacco: Never Used   Alcohol Use: 0.0 oz/week    0 Standard drinks or equivalent per week     Comment: glass of wine monthly    Drug Use: No   Current Outpatient Medications on File Prior to Visit  Medication Sig Dispense Refill   Acetaminophen 500 MG capsule Take by mouth.     Arginine 1000 MG TABS Take by mouth.     cabergoline  (DOSTINEX ) 0.5 MG tablet TAKE 1/2 TABLET ONCE EVERY 2  WEEKS 5 tablet 3   CALCIUM  CITRATE PO Take by mouth.     Coenzyme Q10 (COQ-10 PO) Take by mouth.     diphenhydrAMINE (BENADRYL) 25 MG tablet Take 25 mg by mouth every 6 (six) hours as needed.     famotidine (PEPCID) 20 MG tablet Take 20 mg by mouth every other day.      Ginkgo Biloba Extract 60 MG CAPS Take by mouth.     Krill Oil 1000 MG CAPS Take by mouth.     MAGNESIUM PO Take by mouth.     Melatonin 10 MG TABS Take by mouth.     Multiple Vitamin (MULTIVITAMIN) tablet Take 1 tablet by mouth daily.     Multiple Vitamins-Minerals (ZINC PO) Take by mouth.     NATTOKINASE PO Take by mouth.     niacin 100 MG tablet Take 100 mg by mouth at bedtime.     omeprazole (PRILOSEC OTC) 20 MG tablet Take 20 mg by mouth every other day.      Probiotic Product (PROBIOTIC DAILY PO) Take by mouth.     PSYLLIUM PO Take by mouth.     Pyridoxine HCl (VITAMIN B6 PO) Take by mouth.     QUERCETIN PO Take by mouth.     Red Yeast Rice Extract (RED YEAST RICE PO) Take by mouth.     sildenafil (VIAGRA) 100 MG tablet Take 100 mg by mouth as needed for erectile dysfunction.     Testosterone  20 % CREA by Does not apply route.     vitamin C (ASCORBIC ACID) 500 MG tablet Take 500 mg by mouth daily.     vitamin k 100 MCG tablet Take 100 mcg by mouth daily.     No current facility-administered medications on file prior to visit.   No Known Allergies Family History  Problem Relation Age of Onset   Other Cousin    Heart  disease Father    Hyperlipidemia Father    Hypertension Father    PE: BP 138/78   Pulse 66   Ht 5' 11 (1.803 m)   Wt 180 lb 12.8 oz (82 kg)   SpO2 99%   BMI  25.22 kg/m   Wt Readings from Last 3 Encounters:  11/28/23 180 lb 12.8 oz (82 kg)  11/29/22 174 lb 3.2 oz (79 kg)  11/28/21 187 lb 9.6 oz (85.1 kg)   Constitutional: normal weight, in NAD Eyes:  EOMI, no exophthalmos ENT: no neck masses, no cervical lymphadenopathy Cardiovascular: RRR, No MRG Respiratory: CTA B Musculoskeletal: no deformities Skin:no rashes Neurological: no tremor with outstretched hands  ASSESSMENT: 1. Pituitary tumor  2.  Hyperprolactinemia  3. Hypogonadotropic hypogonadism  PLAN:  1.  Patient with history of pituitary tumor diagnosed in 2000.  This has decreased in size after starting treatment with bromocriptine  and was stable, at 1.5 cm, on his pituitary MRI from 04/19/2014.  However, afterwards, he started Cabergoline  and on the latest pituitary MRI from 2021,the pituitary tumor size decreased to 1.1 cm in the largest dimension.  He had a total of 5 MRIs so far. - Since his adenoma was not in close proximity to the optic chiasm, I did not feel that we needed to repeat his visual fields.  The latest was from 2016: Normal - He denies headaches or visual disturbance - For the last 2 visits, I did recommend an MRI.  He would like to have this done next year. -We will see him back in 1 year  2. Hyperprolactinemia - Likely related to his pituitary tumor (prolactinoma) -He was initially on bromocriptine  due to price.  He tried to come off dopamine agonist in the past but prolactin level increased. - He is currently on Cabergoline , which he tolerates well, without side effects - We were able to decrease the Cabergoline  dose from 0.25 mg weekly to every other week 2 years ago.  We continued this dose at last visit as his prolactin level was excellent - We discussed that with smaller pituitary tumors, we  may consider Cabergoline  holidays, but not usually for macroadenomas - We will recheck his prolactin level today  3. Hypogonadotropic hypogonadism - Now managed by urology - Patient has had low testosterone  levels due to his high prolactin levels in the past and the levels remained low despite efficacy of the prolactinoma treatment.  He did complain of low libido and problems with erections and he was interested in starting treatment for his low testosterone  level.  We discussed  that we have to be very careful with this due to previous history of prostate cancer.  Of note, his urologist gave us  the ok to use testosterone  supplement. Since he also had a high estrogen level at the time at his visit with a urologist in 07/2016, I suggested to try Clomid  first.  We started this in 09/2016.  He noticed more hot flashes but no perceived improvement in libido and erections - Reviewed previous testosterone  levels: testosterone  level was low (total testosterone  144), but he was not fasting at the time of his last visit with his urologist (he usually sees him in June of every year); we repeated his labs on 10/10/2016, fasting, and the testosterone  was still low (total testosterone  158, free testosterone  2.3).  Estrogen level has been normal (10/10/2016: Free estradiol  level 11, normal <29).  Reportedly his most recent testosterone  from 07/2020 was normal at 299. -I suggested testosterone  treatment and he was cleared for this by his urologist (Dr. Gala).  He started the patient on compounded testosterone  cream (cheaper for him) - doing great on this - He did contact me since last visit with a slightly elevated estradiol  level (52).  I advised him  that this is not uncommon in the setting of testosterone  treatment.  I did not recommend treatment, but I did recommend to have it repeated and if he had a higher level, >80 associated with symptoms (hot flashes, breast tenderness), he may try anastrozole.  However, he is not  having the symptoms.  Needs refills.  Orders Placed This Encounter  Procedures   Prolactin   Lela Fendt, MD PhD [facility] Endocr[facility]ology

## 2023-11-29 LAB — PROLACTIN: Prolactin: 4.3 ng/mL (ref 2.0–18.0)

## 2023-12-01 ENCOUNTER — Ambulatory Visit: Payer: Self-pay | Admitting: Internal Medicine

## 2023-12-01 MED ORDER — CABERGOLINE 0.5 MG PO TABS: ORAL_TABLET | ORAL | 3 refills | Status: AC

## 2023-12-01 NOTE — Addendum Note (Signed)
 Addended by: TRIXIE FILE on: 12/01/2023 11:58 AM   Modules accepted: Orders

## 2024-01-01 ENCOUNTER — Ambulatory Visit: Payer: PPO | Admitting: Dermatology

## 2024-01-01 DIAGNOSIS — D1801 Hemangioma of skin and subcutaneous tissue: Secondary | ICD-10-CM

## 2024-01-01 DIAGNOSIS — L814 Other melanin hyperpigmentation: Secondary | ICD-10-CM

## 2024-01-01 DIAGNOSIS — L821 Other seborrheic keratosis: Secondary | ICD-10-CM

## 2024-01-01 DIAGNOSIS — D1722 Benign lipomatous neoplasm of skin and subcutaneous tissue of left arm: Secondary | ICD-10-CM

## 2024-01-01 DIAGNOSIS — W908XXA Exposure to other nonionizing radiation, initial encounter: Secondary | ICD-10-CM | POA: Diagnosis not present

## 2024-01-01 DIAGNOSIS — L578 Other skin changes due to chronic exposure to nonionizing radiation: Secondary | ICD-10-CM

## 2024-01-01 DIAGNOSIS — I781 Nevus, non-neoplastic: Secondary | ICD-10-CM

## 2024-01-01 DIAGNOSIS — D229 Melanocytic nevi, unspecified: Secondary | ICD-10-CM

## 2024-01-01 DIAGNOSIS — Z85828 Personal history of other malignant neoplasm of skin: Secondary | ICD-10-CM

## 2024-01-01 DIAGNOSIS — L570 Actinic keratosis: Secondary | ICD-10-CM

## 2024-01-01 DIAGNOSIS — Z1283 Encounter for screening for malignant neoplasm of skin: Secondary | ICD-10-CM

## 2024-01-01 DIAGNOSIS — Z7189 Other specified counseling: Secondary | ICD-10-CM

## 2024-01-01 NOTE — Patient Instructions (Addendum)

## 2024-01-01 NOTE — Progress Notes (Signed)
 Follow-Up Visit   Subjective  Zachary Hayden is a 73 y.o. male who presents for the following: Skin Cancer Screening and Full Body Skin Exam. Hx of bcc, hx of aks, hx of isks.   The patient presents for Total-Body Skin Exam (TBSE) for skin cancer screening and mole check. The patient has spots, moles and lesions to be evaluated, some may be new or changing and the patient may have concern these could be cancer.  The following portions of the chart were reviewed this encounter and updated as appropriate: medications, allergies, medical history  Review of Systems:  No other skin or systemic complaints except as noted in HPI or Assessment and Plan.  Objective  Well appearing patient in no apparent distress; mood and affect are within normal limits.  A full examination was performed including scalp, head, eyes, ears, nose, lips, neck, chest, axillae, abdomen, back, buttocks, bilateral upper extremities, bilateral lower extremities, hands, feet, fingers, toes, fingernails, and toenails. All findings within normal limits unless otherwise noted below.   Relevant physical exam findings are noted in the Assessment and Plan.  Scalp x1 Pink scaly macules  Assessment & Plan   SKIN CANCER SCREENING PERFORMED TODAY.  ACTINIC DAMAGE - Chronic condition, secondary to cumulative UV/sun exposure - diffuse scaly erythematous macules with underlying dyspigmentation - Recommend daily broad spectrum sunscreen SPF 30+ to sun-exposed areas, reapply every 2 hours as needed.  - Staying in the shade or wearing long sleeves, sun glasses (UVA+UVB protection) and wide brim hats (4-inch brim around the entire circumference of the hat) are also recommended for sun protection.  - Call for new or changing lesions.  LENTIGINES, SEBORRHEIC KERATOSES, HEMANGIOMAS - Benign normal skin lesions - Benign-appearing - Call for any changes  MELANOCYTIC NEVI - Tan-brown and/or pink-flesh-colored symmetric macules and  papules - Benign appearing on exam today - Observation - Call clinic for new or changing moles - Recommend daily use of broad spectrum spf 30+ sunscreen to sun-exposed areas.   Lipoma  Exam: Subcutaneous rubbery nodule(s) Location: left upper arm    Benign-appearing. Exam most consistent with a lipoma. Discussed that a lipoma is a benign fatty growth that can grow over time and sometimes get irritated. Recommend observation if it is not bothersome or changing. Discussed option of ILK injections or surgical excision to remove it if it is growing, symptomatic, or other changes noted. Please call for new or changing lesions so they can be evaluated.   HISTORY OF BASAL CELL CARCINOMA OF THE SKIN Right top of shoulder 08/2018 - No evidence of recurrence today - Recommend regular full body skin exams - Recommend daily broad spectrum sunscreen SPF 30+ to sun-exposed areas, reapply every 2 hours as needed.  - Call if any new or changing lesions are noted between office visits   TELANGIECTASIA Exam: dilated blood vessel(s) at left ear middle anti helix   Treatment Plan: Benign appearing on exam Call for changes  AK (ACTINIC KERATOSIS) Scalp x1 Actinic keratoses are precancerous spots that appear secondary to cumulative UV radiation exposure/sun exposure over time. They are chronic with expected duration over 1 year. A portion of actinic keratoses will progress to squamous cell carcinoma of the skin. It is not possible to reliably predict which spots will progress to skin cancer and so treatment is recommended to prevent development of skin cancer.  Recommend daily broad spectrum sunscreen SPF 30+ to sun-exposed areas, reapply every 2 hours as needed.  Recommend staying in the shade  or wearing long sleeves, sun glasses (UVA+UVB protection) and wide brim hats (4-inch brim around the entire circumference of the hat). Call for new or changing lesions. Destruction of lesion - Scalp x1 Complexity:  simple   Destruction method: cryotherapy   Informed consent: discussed and consent obtained   Timeout:  patient name, date of birth, surgical site, and procedure verified Lesion destroyed using liquid nitrogen: Yes   Region frozen until ice ball extended beyond lesion: Yes   Outcome: patient tolerated procedure well with no complications   Post-procedure details: wound care instructions given   Additional details:  Prior to procedure, discussed risks of blister formation, small wound, skin dyspigmentation, or rare scar following cryotherapy. Recommend Vaseline ointment to treated areas while healing.   Return in about 1 year (around 12/31/2024) for TBSE, HxBCC, HxAKs, HxISK, w/ Dr. Hester.  I, Berthel Bagnall V. Wilfred, CMA, am acting as scribe for Alm Hester, MD.  Documentation: I have reviewed the above documentation for accuracy and completeness, and I agree with the above.  Alm Hester, MD

## 2024-01-05 ENCOUNTER — Encounter: Payer: Self-pay | Admitting: Dermatology

## 2024-11-30 ENCOUNTER — Ambulatory Visit: Admitting: Internal Medicine

## 2025-01-05 ENCOUNTER — Ambulatory Visit: Admitting: Dermatology
